# Patient Record
Sex: Female | Born: 1950 | Race: White | Hispanic: No | Smoking: Former smoker
Health system: Southern US, Community
[De-identification: ages and names within clinical notes are randomized; demographics above are authoritative.]

## PROBLEM LIST (undated history)

## (undated) DIAGNOSIS — M1990 Unspecified osteoarthritis, unspecified site: Secondary | ICD-10-CM

## (undated) DIAGNOSIS — J45909 Unspecified asthma, uncomplicated: Secondary | ICD-10-CM

## (undated) DIAGNOSIS — I10 Essential (primary) hypertension: Secondary | ICD-10-CM

## (undated) DIAGNOSIS — K5790 Diverticulosis of intestine, part unspecified, without perforation or abscess without bleeding: Secondary | ICD-10-CM

## (undated) HISTORY — PX: TONSILLECTOMY: SUR1361

## (undated) HISTORY — PX: DILATION AND CURETTAGE OF UTERUS: SHX78

## (undated) HISTORY — PX: APPENDECTOMY: SHX54

---

## 2001-10-09 HISTORY — PX: CARPAL TUNNEL RELEASE: SHX101

## 2009-10-09 HISTORY — PX: ROTATOR CUFF REPAIR: SHX139

## 2014-12-03 DIAGNOSIS — E669 Obesity, unspecified: Secondary | ICD-10-CM | POA: Insufficient documentation

## 2014-12-03 DIAGNOSIS — J45909 Unspecified asthma, uncomplicated: Secondary | ICD-10-CM | POA: Insufficient documentation

## 2015-03-15 DIAGNOSIS — E785 Hyperlipidemia, unspecified: Secondary | ICD-10-CM | POA: Insufficient documentation

## 2015-03-15 DIAGNOSIS — R7301 Impaired fasting glucose: Secondary | ICD-10-CM | POA: Insufficient documentation

## 2015-03-15 DIAGNOSIS — I10 Essential (primary) hypertension: Secondary | ICD-10-CM | POA: Insufficient documentation

## 2017-06-18 ENCOUNTER — Other Ambulatory Visit: Payer: Self-pay | Admitting: Family Medicine

## 2017-06-18 DIAGNOSIS — Z1231 Encounter for screening mammogram for malignant neoplasm of breast: Secondary | ICD-10-CM

## 2017-06-26 ENCOUNTER — Encounter: Payer: Self-pay | Admitting: Radiology

## 2017-06-26 ENCOUNTER — Ambulatory Visit
Admission: RE | Admit: 2017-06-26 | Discharge: 2017-06-26 | Disposition: A | Discharge status: Home / Self Care | Payer: Medicare Other | Source: Ambulatory Visit | Attending: Family Medicine | Admitting: Family Medicine

## 2017-06-26 DIAGNOSIS — Z1231 Encounter for screening mammogram for malignant neoplasm of breast: Secondary | ICD-10-CM | POA: Diagnosis present

## 2017-07-04 ENCOUNTER — Encounter
Admission: RE | Admit: 2017-07-04 | Discharge: 2017-07-04 | Disposition: A | Discharge status: Home / Self Care | Payer: Medicare Other | Source: Ambulatory Visit | Attending: Orthopedic Surgery | Admitting: Orthopedic Surgery

## 2017-07-04 DIAGNOSIS — Z79899 Other long term (current) drug therapy: Secondary | ICD-10-CM | POA: Diagnosis not present

## 2017-07-04 DIAGNOSIS — Z7982 Long term (current) use of aspirin: Secondary | ICD-10-CM | POA: Insufficient documentation

## 2017-07-04 DIAGNOSIS — I10 Essential (primary) hypertension: Secondary | ICD-10-CM | POA: Insufficient documentation

## 2017-07-04 DIAGNOSIS — Z01818 Encounter for other preprocedural examination: Secondary | ICD-10-CM | POA: Insufficient documentation

## 2017-07-04 DIAGNOSIS — M1712 Unilateral primary osteoarthritis, left knee: Secondary | ICD-10-CM | POA: Diagnosis not present

## 2017-07-04 DIAGNOSIS — R9431 Abnormal electrocardiogram [ECG] [EKG]: Secondary | ICD-10-CM | POA: Diagnosis not present

## 2017-07-04 HISTORY — DX: Diverticulosis of intestine, part unspecified, without perforation or abscess without bleeding: K57.90

## 2017-07-04 HISTORY — DX: Unspecified asthma, uncomplicated: J45.909

## 2017-07-04 HISTORY — DX: Unspecified osteoarthritis, unspecified site: M19.90

## 2017-07-04 HISTORY — DX: Essential (primary) hypertension: I10

## 2017-07-04 LAB — CBC
HCT: 45.4 % (ref 35.0–47.0)
HEMOGLOBIN: 15.2 g/dL (ref 12.0–16.0)
MCH: 30.9 pg (ref 26.0–34.0)
MCHC: 33.5 g/dL (ref 32.0–36.0)
MCV: 92.5 fL (ref 80.0–100.0)
Platelets: 265 10*3/uL (ref 150–440)
RBC: 4.91 MIL/uL (ref 3.80–5.20)
RDW: 14.2 % (ref 11.5–14.5)
WBC: 11.1 10*3/uL — ABNORMAL HIGH (ref 3.6–11.0)

## 2017-07-04 LAB — URINALYSIS, ROUTINE W REFLEX MICROSCOPIC
Bilirubin Urine: NEGATIVE
GLUCOSE, UA: NEGATIVE mg/dL
HGB URINE DIPSTICK: NEGATIVE
Ketones, ur: NEGATIVE mg/dL
Leukocytes, UA: NEGATIVE
Nitrite: NEGATIVE
PH: 7 (ref 5.0–8.0)
Protein, ur: NEGATIVE mg/dL
SPECIFIC GRAVITY, URINE: 1.015 (ref 1.005–1.030)

## 2017-07-04 LAB — COMPREHENSIVE METABOLIC PANEL
ALK PHOS: 52 U/L (ref 38–126)
ALT: 21 U/L (ref 14–54)
ANION GAP: 13 (ref 5–15)
AST: 23 U/L (ref 15–41)
Albumin: 4.8 g/dL (ref 3.5–5.0)
BILIRUBIN TOTAL: 0.5 mg/dL (ref 0.3–1.2)
BUN: 15 mg/dL (ref 6–20)
CALCIUM: 9.9 mg/dL (ref 8.9–10.3)
CO2: 26 mmol/L (ref 22–32)
Chloride: 103 mmol/L (ref 101–111)
Creatinine, Ser: 0.6 mg/dL (ref 0.44–1.00)
GFR calc non Af Amer: >60 mL/min (ref >60)
Glucose, Bld: 100 mg/dL — ABNORMAL HIGH (ref 65–99)
Potassium: 3.4 mmol/L — ABNORMAL LOW (ref 3.5–5.1)
SODIUM: 142 mmol/L (ref 135–145)
TOTAL PROTEIN: 7.6 g/dL (ref 6.5–8.1)

## 2017-07-04 LAB — PROTIME-INR
INR: 0.87
PROTHROMBIN TIME: 11.7 s (ref 11.4–15.2)

## 2017-07-04 LAB — TYPE AND SCREEN
ABO/RH(D): O POS
Antibody Screen: NEGATIVE

## 2017-07-04 LAB — SEDIMENTATION RATE: Sed Rate: 1 mm/hr (ref 0–30)

## 2017-07-04 LAB — APTT: aPTT: 34 seconds (ref 24–36)

## 2017-07-04 LAB — SURGICAL PCR SCREEN
MRSA, PCR: NEGATIVE
STAPHYLOCOCCUS AUREUS: POSITIVE — AB

## 2017-07-04 LAB — C-REACTIVE PROTEIN: CRP: 1.1 mg/dL — ABNORMAL HIGH (ref <1.0)

## 2017-07-04 NOTE — Patient Instructions (Signed)
Your procedure is scheduled on: July 16, 2017 [facility] ) Report to Same Day Surgery 2nd floor medical mall (Medical Mall Entrance-take elevator on left to 2nd floor.  Check in with surgery information desk.) To find out your arrival time please call [phone] between 1PM - 3PM on July 13, 2017 (FRIDAY )   Remember: Instructions that are not followed completely may result in serious medical risk, up to and including death, or upon the discretion of your surgeon and anesthesiologist your surgery may need to be rescheduled.    _x___ 1. Do not eat food after midnight the night before your procedure. You may drink clear liquids up to 2 hours before you are scheduled to arrive at the hospital for your procedure.  Do not drink clear liquids within 2 hours of your scheduled arrival to the hospital.  Clear liquids include  --Water or Apple juice without pulp  --Clear carbohydrate beverage such as ClearFast or Gatorade  --Black Coffee or Clear Tea (No milk, no creamers, do not add anything to                  the coffee or Tea Type 1 and type 2 diabetics should only drink water.  No gum chewing or hard candies.     __x__ 2. No Alcohol for 24 hours before or after surgery.   __x__3. No Smoking for 24 prior to surgery.   ____  4. Bring all medications with you on the day of surgery if instructed.    __x__ 5. Notify your doctor if there is any change in your medical condition     (cold, fever, infections).     Do not wear jewelry, make-up, hairpins, clips or nail polish.  Do not wear lotions, powders, or perfumes.  Do not shave 48 hours prior to surgery. Men may shave face and neck.  Do not bring valuables to the hospital.    [facility] is not responsible for any belongings or valuables.               Contacts, dentures or bridgework may not be worn into surgery.  Leave your suitcase in the car. After surgery it may be brought to your room.  For patients admitted to the hospital,  discharge time is determined by your treatment team                   Patients discharged the day of surgery will not be allowed to drive home.  You will need someone to drive you home and stay with you the night of your procedure.    Please read over the following fact sheets that you were given:   [facility] Preparing for Surgery and or MRSA Information  ___ Take anti-hypertensive listed below, cardiac, seizure, asthma,     anti-reflux and psychiatric medicines. These include:  1.   2.  3.  4.  5.  6.  ____Fleets enema or Magnesium Citrate as directed.   _x___ Use CHG Soap or sage wipes as directed on instruction sheet   _x___ Use inhalers on the day of surgery and bring to hospital day of surgery (USE ARNUITY ELLIPTA AND ALBUTEROL INHALERS THE MORNING OF SURGERY AND BRING INHALERS TO HOSPITAL THE DAY OF SURGERY  )  ____ Stop Metformin and Janumet 2 days prior to surgery.    ____ Take 1/2 of usual insulin dose the night before surgery and none on the morning surgery.     _x___ Follow  recommendations from Cardiologist, Pulmonologist or PCP regarding          stopping Aspirin, Coumadin, Plavix ,Eliquis, Effient, or Pradaxa, and Pletal. (STOP ASPIRIN ONE WEEK PRIOR TO SURGERY )   X____Stop Anti-inflammatories such as Advil, Aleve, Ibuprofen, Motrin, Naproxen, Naprosyn, Goodies powders or aspirin products. OK to take Tylenol    _x___ Stop supplements until after surgery.  But may continue Vitamin D, Vitamin B, and multivitamin (STOP VITAMIN C, VITAMIN E, BIOTIN, AND MELATONIN NOW )        ____ Bring C-Pap to the hospital.

## 2017-07-04 NOTE — Pre-Procedure Instructions (Signed)
Positive staph results faxed to Dr. Ernest Pine office.

## 2017-07-05 LAB — URINE CULTURE
CULTURE: NO GROWTH
SPECIAL REQUESTS: NORMAL

## 2017-07-15 MED ORDER — TRANEXAMIC ACID 1000 MG/10ML IV SOLN
1000.0000 mg | INTRAVENOUS | Status: DC
  Filled 2017-07-15: qty 10

## 2017-07-15 MED ORDER — CEFAZOLIN SODIUM-DEXTROSE 2-4 GM/100ML-% IV SOLN
2.0000 g | INTRAVENOUS | Status: AC
  Administered 2017-07-16: 2 g via INTRAVENOUS

## 2017-07-16 ENCOUNTER — Inpatient Hospital Stay: Payer: Medicare Other | Admitting: Anesthesiology

## 2017-07-16 ENCOUNTER — Inpatient Hospital Stay: Payer: Medicare Other

## 2017-07-16 ENCOUNTER — Inpatient Hospital Stay
Admission: RE | Admit: 2017-07-16 | Discharge: 2017-07-18 | DRG: 470 | Disposition: A | Discharge status: Home Health | Payer: Medicare Other | Source: Ambulatory Visit | Attending: Orthopedic Surgery | Admitting: Orthopedic Surgery

## 2017-07-16 ENCOUNTER — Encounter
Admission: RE | Disposition: A | Discharge status: Home Health | Payer: Self-pay | Source: Ambulatory Visit | Attending: Orthopedic Surgery

## 2017-07-16 ENCOUNTER — Encounter: Payer: Self-pay | Admitting: Orthopedic Surgery

## 2017-07-16 DIAGNOSIS — Z885 Allergy status to narcotic agent status: Secondary | ICD-10-CM | POA: Diagnosis not present

## 2017-07-16 DIAGNOSIS — Z882 Allergy status to sulfonamides status: Secondary | ICD-10-CM

## 2017-07-16 DIAGNOSIS — I10 Essential (primary) hypertension: Secondary | ICD-10-CM | POA: Diagnosis present

## 2017-07-16 DIAGNOSIS — Z79899 Other long term (current) drug therapy: Secondary | ICD-10-CM

## 2017-07-16 DIAGNOSIS — Z7951 Long term (current) use of inhaled steroids: Secondary | ICD-10-CM

## 2017-07-16 DIAGNOSIS — K5790 Diverticulosis of intestine, part unspecified, without perforation or abscess without bleeding: Secondary | ICD-10-CM | POA: Diagnosis present

## 2017-07-16 DIAGNOSIS — Z7982 Long term (current) use of aspirin: Secondary | ICD-10-CM | POA: Diagnosis not present

## 2017-07-16 DIAGNOSIS — M1712 Unilateral primary osteoarthritis, left knee: Secondary | ICD-10-CM | POA: Diagnosis present

## 2017-07-16 DIAGNOSIS — J45909 Unspecified asthma, uncomplicated: Secondary | ICD-10-CM | POA: Diagnosis present

## 2017-07-16 DIAGNOSIS — Z87891 Personal history of nicotine dependence: Secondary | ICD-10-CM | POA: Diagnosis not present

## 2017-07-16 DIAGNOSIS — Z96652 Presence of left artificial knee joint: Secondary | ICD-10-CM

## 2017-07-16 DIAGNOSIS — Z888 Allergy status to other drugs, medicaments and biological substances status: Secondary | ICD-10-CM | POA: Diagnosis not present

## 2017-07-16 DIAGNOSIS — Z881 Allergy status to other antibiotic agents status: Secondary | ICD-10-CM

## 2017-07-16 DIAGNOSIS — Z96659 Presence of unspecified artificial knee joint: Secondary | ICD-10-CM

## 2017-07-16 HISTORY — PX: KNEE ARTHROPLASTY: SHX992

## 2017-07-16 LAB — ABO/RH: ABO/RH(D): O POS

## 2017-07-16 SURGERY — ARTHROPLASTY, KNEE, TOTAL, USING IMAGELESS COMPUTER-ASSISTED NAVIGATION
Anesthesia: General | Site: Knee | Laterality: Left | Wound class: Clean

## 2017-07-16 MED ORDER — SODIUM CHLORIDE 0.9 % IV SOLN
INTRAVENOUS | Status: DC
  Administered 2017-07-16 (×2): via INTRAVENOUS

## 2017-07-16 MED ORDER — ALBUTEROL SULFATE (2.5 MG/3ML) 0.083% IN NEBU: 2.5000 mg | INHALATION_SOLUTION | Freq: Four times a day (QID) | RESPIRATORY_TRACT | Status: DC | PRN

## 2017-07-16 MED ORDER — PROPOFOL 500 MG/50ML IV EMUL
INTRAVENOUS | Status: AC
  Filled 2017-07-16: qty 50

## 2017-07-16 MED ORDER — MAGNESIUM HYDROXIDE 400 MG/5ML PO SUSP
30.0000 mL | Freq: Every day | ORAL | Status: DC | PRN
  Administered 2017-07-16 – 2017-07-18 (×2): 30 mL via ORAL
  Filled 2017-07-16 (×2): qty 30

## 2017-07-16 MED ORDER — ONDANSETRON HCL 4 MG/2ML IJ SOLN
4.0000 mg | Freq: Four times a day (QID) | INTRAMUSCULAR | Status: DC | PRN
  Administered 2017-07-17: 4 mg via INTRAVENOUS
  Filled 2017-07-16: qty 2

## 2017-07-16 MED ORDER — MIDAZOLAM HCL 2 MG/2ML IJ SOLN
INTRAMUSCULAR | Status: AC
  Filled 2017-07-16: qty 2

## 2017-07-16 MED ORDER — DEXTROSE 5 % IV SOLN
2.0000 g | Freq: Four times a day (QID) | INTRAVENOUS | Status: AC
  Administered 2017-07-16 – 2017-07-17 (×4): 2 g via INTRAVENOUS
  Filled 2017-07-16 (×4): qty 2000

## 2017-07-16 MED ORDER — LORATADINE 10 MG PO TABS: 10.0000 mg | ORAL_TABLET | Freq: Every day | ORAL | Status: DC | PRN

## 2017-07-16 MED ORDER — SODIUM CHLORIDE 0.9 % IV SOLN
INTRAVENOUS | Status: DC | PRN
  Administered 2017-07-16: 60 mL

## 2017-07-16 MED ORDER — VITAMIN E 180 MG (400 UNIT) PO CAPS
400.0000 [IU] | ORAL_CAPSULE | Freq: Every day | ORAL | Status: DC
  Administered 2017-07-17 – 2017-07-18 (×2): 400 [IU] via ORAL
  Filled 2017-07-16 (×3): qty 1

## 2017-07-16 MED ORDER — VITAMIN C 500 MG PO TABS
1500.0000 mg | ORAL_TABLET | Freq: Every day | ORAL | Status: DC
  Administered 2017-07-17 – 2017-07-18 (×2): 1500 mg via ORAL
  Filled 2017-07-16 (×3): qty 3

## 2017-07-16 MED ORDER — DIPHENHYDRAMINE HCL 12.5 MG/5ML PO ELIX: 12.5000 mg | ORAL_SOLUTION | ORAL | Status: DC | PRN

## 2017-07-16 MED ORDER — HYDROCHLOROTHIAZIDE 12.5 MG PO CAPS
12.5000 mg | ORAL_CAPSULE | Freq: Every day | ORAL | Status: DC
  Administered 2017-07-16 – 2017-07-18 (×3): 12.5 mg via ORAL
  Filled 2017-07-16 (×3): qty 1

## 2017-07-16 MED ORDER — PROPOFOL 500 MG/50ML IV EMUL
INTRAVENOUS | Status: DC | PRN
  Administered 2017-07-16: 125 ug/kg/min via INTRAVENOUS

## 2017-07-16 MED ORDER — BUDESONIDE 0.5 MG/2ML IN SUSP
0.5000 mg | Freq: Two times a day (BID) | RESPIRATORY_TRACT | Status: DC
  Filled 2017-07-16: qty 2

## 2017-07-16 MED ORDER — SUCCINYLCHOLINE CHLORIDE 20 MG/ML IJ SOLN
INTRAMUSCULAR | Status: AC
  Filled 2017-07-16: qty 1

## 2017-07-16 MED ORDER — FAMOTIDINE 20 MG PO TABS: 20.0000 mg | ORAL_TABLET | Freq: Once | ORAL | Status: DC

## 2017-07-16 MED ORDER — PHENOL 1.4 % MT LIQD
1.0000 | OROMUCOSAL | Status: DC | PRN
  Filled 2017-07-16: qty 177

## 2017-07-16 MED ORDER — MORPHINE SULFATE (PF) 2 MG/ML IV SOLN
2.0000 mg | INTRAVENOUS | Status: DC | PRN
  Administered 2017-07-16 (×2): 2 mg via INTRAVENOUS
  Filled 2017-07-16 (×2): qty 1

## 2017-07-16 MED ORDER — BUDESONIDE 0.5 MG/2ML IN SUSP
0.5000 mg | Freq: Two times a day (BID) | RESPIRATORY_TRACT | Status: DC | PRN
Start: 2017-07-16 — End: 2017-07-18

## 2017-07-16 MED ORDER — ACETAMINOPHEN 650 MG RE SUPP: 650.0000 mg | Freq: Four times a day (QID) | RECTAL | Status: DC | PRN

## 2017-07-16 MED ORDER — CEFAZOLIN SODIUM-DEXTROSE 2-4 GM/100ML-% IV SOLN
INTRAVENOUS | Status: AC
  Filled 2017-07-16: qty 100

## 2017-07-16 MED ORDER — MENTHOL 3 MG MT LOZG
1.0000 | LOZENGE | OROMUCOSAL | Status: DC | PRN
  Filled 2017-07-16: qty 9

## 2017-07-16 MED ORDER — SODIUM CHLORIDE 0.9 % IJ SOLN
INTRAMUSCULAR | Status: AC
  Filled 2017-07-16: qty 50

## 2017-07-16 MED ORDER — ACETAMINOPHEN 10 MG/ML IV SOLN
1000.0000 mg | Freq: Four times a day (QID) | INTRAVENOUS | Status: AC
  Administered 2017-07-16 – 2017-07-17 (×4): 1000 mg via INTRAVENOUS
  Filled 2017-07-16 (×3): qty 100

## 2017-07-16 MED ORDER — FENTANYL CITRATE (PF) 100 MCG/2ML IJ SOLN
INTRAMUSCULAR | Status: AC
  Filled 2017-07-16: qty 2

## 2017-07-16 MED ORDER — ONDANSETRON HCL 4 MG/2ML IJ SOLN: 4.0000 mg | Freq: Once | INTRAMUSCULAR | Status: DC | PRN

## 2017-07-16 MED ORDER — TRANEXAMIC ACID 1000 MG/10ML IV SOLN
INTRAVENOUS | Status: DC | PRN
  Administered 2017-07-16: 1000 mg via INTRAVENOUS

## 2017-07-16 MED ORDER — METOCLOPRAMIDE HCL 10 MG PO TABS
10.0000 mg | ORAL_TABLET | Freq: Three times a day (TID) | ORAL | Status: AC
  Administered 2017-07-16 – 2017-07-18 (×8): 10 mg via ORAL
  Filled 2017-07-16 (×8): qty 1

## 2017-07-16 MED ORDER — POTASSIUM 99 MG PO TABS: 99.0000 mg | ORAL_TABLET | Freq: Every day | ORAL | Status: DC

## 2017-07-16 MED ORDER — EPHEDRINE SULFATE 50 MG/ML IJ SOLN
INTRAMUSCULAR | Status: AC
  Filled 2017-07-16: qty 1

## 2017-07-16 MED ORDER — ACETAMINOPHEN 10 MG/ML IV SOLN
INTRAVENOUS | Status: AC
Start: 2017-07-16 — End: 2017-07-16
  Administered 2017-07-16: 1000 mg via INTRAVENOUS
  Filled 2017-07-16: qty 100

## 2017-07-16 MED ORDER — FAMOTIDINE 20 MG PO TABS
ORAL_TABLET | ORAL | Status: AC
  Administered 2017-07-16: 20 mg
  Filled 2017-07-16: qty 1

## 2017-07-16 MED ORDER — FLEET ENEMA 7-19 GM/118ML RE ENEM: 1.0000 | ENEMA | Freq: Once | RECTAL | Status: DC | PRN

## 2017-07-16 MED ORDER — BUPIVACAINE HCL (PF) 0.25 % IJ SOLN
INTRAMUSCULAR | Status: DC | PRN
Start: 2017-07-16 — End: 2017-07-16
  Administered 2017-07-16: 60 mL

## 2017-07-16 MED ORDER — MONTELUKAST SODIUM 10 MG PO TABS
10.0000 mg | ORAL_TABLET | Freq: Every day | ORAL | Status: DC
  Administered 2017-07-16 – 2017-07-17 (×2): 10 mg via ORAL
  Filled 2017-07-16 (×2): qty 1

## 2017-07-16 MED ORDER — FENTANYL CITRATE (PF) 100 MCG/2ML IJ SOLN
INTRAMUSCULAR | Status: DC | PRN
  Administered 2017-07-16: 25 ug via INTRAVENOUS

## 2017-07-16 MED ORDER — SORBITOL 70 % SOLN
30.0000 mL | Freq: Every day | Status: DC | PRN
  Filled 2017-07-16: qty 30

## 2017-07-16 MED ORDER — CALCIUM POLYCARBOPHIL 625 MG PO TABS
625.0000 mg | ORAL_TABLET | Freq: Two times a day (BID) | ORAL | Status: DC
  Administered 2017-07-17 – 2017-07-18 (×2): 625 mg via ORAL
  Filled 2017-07-16 (×7): qty 1

## 2017-07-16 MED ORDER — GLYCOPYRROLATE 0.2 MG/ML IJ SOLN
INTRAMUSCULAR | Status: AC
  Filled 2017-07-16: qty 1

## 2017-07-16 MED ORDER — ADULT MULTIVITAMIN W/MINERALS CH
1.0000 | ORAL_TABLET | Freq: Every day | ORAL | Status: DC
  Administered 2017-07-16 – 2017-07-17 (×2): 1 via ORAL
  Filled 2017-07-16 (×2): qty 1

## 2017-07-16 MED ORDER — PHENYLEPHRINE HCL 10 MG/ML IJ SOLN
INTRAMUSCULAR | Status: AC
  Filled 2017-07-16: qty 1

## 2017-07-16 MED ORDER — MIDAZOLAM HCL 5 MG/5ML IJ SOLN
INTRAMUSCULAR | Status: DC | PRN
  Administered 2017-07-16 (×2): 1 mg via INTRAVENOUS

## 2017-07-16 MED ORDER — ONDANSETRON HCL 4 MG PO TABS
4.0000 mg | ORAL_TABLET | Freq: Four times a day (QID) | ORAL | Status: DC | PRN
  Administered 2017-07-17: 4 mg via ORAL
  Filled 2017-07-16: qty 1

## 2017-07-16 MED ORDER — ONDANSETRON HCL 4 MG/2ML IJ SOLN
INTRAMUSCULAR | Status: AC
  Filled 2017-07-16: qty 2

## 2017-07-16 MED ORDER — CEFAZOLIN SODIUM-DEXTROSE 2-4 GM/100ML-% IV SOLN: 2.0000 g | Freq: Four times a day (QID) | INTRAVENOUS | Status: DC

## 2017-07-16 MED ORDER — CHLORHEXIDINE GLUCONATE 4 % EX LIQD: 60.0000 mL | Freq: Once | CUTANEOUS | Status: DC

## 2017-07-16 MED ORDER — FENTANYL CITRATE (PF) 100 MCG/2ML IJ SOLN: 25.0000 ug | INTRAMUSCULAR | Status: DC | PRN

## 2017-07-16 MED ORDER — BUPIVACAINE HCL (PF) 0.5 % IJ SOLN
INTRAMUSCULAR | Status: AC
  Filled 2017-07-16: qty 10

## 2017-07-16 MED ORDER — ONDANSETRON HCL 4 MG/2ML IJ SOLN
INTRAMUSCULAR | Status: DC | PRN
  Administered 2017-07-16: 4 mg via INTRAVENOUS

## 2017-07-16 MED ORDER — ACETAMINOPHEN 325 MG PO TABS
650.0000 mg | ORAL_TABLET | Freq: Four times a day (QID) | ORAL | Status: DC | PRN
  Administered 2017-07-18: 650 mg via ORAL
  Filled 2017-07-16 (×2): qty 2

## 2017-07-16 MED ORDER — LACTATED RINGERS IV SOLN
INTRAVENOUS | Status: DC
  Administered 2017-07-16: 06:00:00 via INTRAVENOUS

## 2017-07-16 MED ORDER — LIDOCAINE HCL (PF) 2 % IJ SOLN
INTRAMUSCULAR | Status: AC
  Filled 2017-07-16: qty 10

## 2017-07-16 MED ORDER — BUPIVACAINE HCL (PF) 0.25 % IJ SOLN
INTRAMUSCULAR | Status: AC
  Filled 2017-07-16: qty 10

## 2017-07-16 MED ORDER — NEOMYCIN-POLYMYXIN B GU 40-200000 IR SOLN
Status: AC
  Filled 2017-07-16: qty 20

## 2017-07-16 MED ORDER — FERROUS SULFATE 325 (65 FE) MG PO TABS
325.0000 mg | ORAL_TABLET | Freq: Two times a day (BID) | ORAL | Status: DC
  Administered 2017-07-16 – 2017-07-18 (×4): 325 mg via ORAL
  Filled 2017-07-16 (×4): qty 1

## 2017-07-16 MED ORDER — NEOMYCIN-POLYMYXIN B GU 40-200000 IR SOLN
Status: DC | PRN
Start: 2017-07-16 — End: 2017-07-16
  Administered 2017-07-16: 14 mL

## 2017-07-16 MED ORDER — SENNOSIDES-DOCUSATE SODIUM 8.6-50 MG PO TABS
1.0000 | ORAL_TABLET | Freq: Two times a day (BID) | ORAL | Status: DC
Start: 2017-07-16 — End: 2017-07-18
  Administered 2017-07-16 – 2017-07-18 (×3): 1 via ORAL
  Filled 2017-07-16 (×4): qty 1

## 2017-07-16 MED ORDER — ALUM & MAG HYDROXIDE-SIMETH 200-200-20 MG/5ML PO SUSP: 30.0000 mL | ORAL | Status: DC | PRN

## 2017-07-16 MED ORDER — POLYVINYL ALCOHOL 1.4 % OP SOLN
1.0000 [drp] | Freq: Three times a day (TID) | OPHTHALMIC | Status: DC | PRN
Start: 2017-07-16 — End: 2017-07-18
  Filled 2017-07-16: qty 15

## 2017-07-16 MED ORDER — ENOXAPARIN SODIUM 30 MG/0.3ML ~~LOC~~ SOLN
30.0000 mg | Freq: Two times a day (BID) | SUBCUTANEOUS | Status: DC
  Administered 2017-07-17 (×2): 30 mg via SUBCUTANEOUS
  Filled 2017-07-16 (×2): qty 0.3

## 2017-07-16 MED ORDER — PANTOPRAZOLE SODIUM 40 MG PO TBEC
40.0000 mg | DELAYED_RELEASE_TABLET | Freq: Two times a day (BID) | ORAL | Status: DC
  Administered 2017-07-16 – 2017-07-18 (×4): 40 mg via ORAL
  Filled 2017-07-16 (×4): qty 1

## 2017-07-16 MED ORDER — BUPIVACAINE LIPOSOME 1.3 % IJ SUSP
INTRAMUSCULAR | Status: AC
  Filled 2017-07-16: qty 20

## 2017-07-16 MED ORDER — OXYCODONE HCL 5 MG PO TABS
5.0000 mg | ORAL_TABLET | ORAL | Status: DC | PRN
  Administered 2017-07-16: 5 mg via ORAL
  Administered 2017-07-16: 10 mg via ORAL
  Administered 2017-07-16: 5 mg via ORAL
  Administered 2017-07-16 – 2017-07-17 (×3): 10 mg via ORAL
  Filled 2017-07-16 (×2): qty 2
  Filled 2017-07-16: qty 1
  Filled 2017-07-16: qty 2
  Filled 2017-07-16: qty 1
  Filled 2017-07-16 (×2): qty 2

## 2017-07-16 MED ORDER — BUPIVACAINE HCL (PF) 0.5 % IJ SOLN
INTRAMUSCULAR | Status: DC | PRN
  Administered 2017-07-16: 3 mL

## 2017-07-16 MED ORDER — ALBUTEROL SULFATE HFA 108 (90 BASE) MCG/ACT IN AERS: 2.0000 | INHALATION_SPRAY | Freq: Four times a day (QID) | RESPIRATORY_TRACT | Status: DC | PRN

## 2017-07-16 MED ORDER — TRAMADOL HCL 50 MG PO TABS
50.0000 mg | ORAL_TABLET | ORAL | Status: DC | PRN
  Administered 2017-07-16 – 2017-07-18 (×6): 100 mg via ORAL
  Filled 2017-07-16 (×6): qty 2

## 2017-07-16 MED ORDER — TRANEXAMIC ACID 1000 MG/10ML IV SOLN
1000.0000 mg | Freq: Once | INTRAVENOUS | Status: AC
  Administered 2017-07-16: 1000 mg via INTRAVENOUS
  Filled 2017-07-16: qty 10

## 2017-07-16 SURGICAL SUPPLY — 69 items
ATTUNE PSFEM LTSZ6 NARCEM KNEE (Femur) ×3 IMPLANT
ATTUNE PSRP INSR SZ6 5 KNEE (Insert) ×2 IMPLANT
ATTUNE PSRP INSR SZ6 5MM KNEE (Insert) ×1 IMPLANT
BASEPLATE TIBIAL ROTATING SZ 4 (Knees) ×3 IMPLANT
BATTERY INSTRU NAVIGATION (MISCELLANEOUS) ×12 IMPLANT
BLADE SAW 1 (BLADE) ×3 IMPLANT
BLADE SAW 1/2 (BLADE) ×3 IMPLANT
BLADE SAW 70X12.5 (BLADE) IMPLANT
CANISTER SUCT 1200ML W/VALVE (MISCELLANEOUS) ×3 IMPLANT
CANISTER SUCT 3000ML PPV (MISCELLANEOUS) ×6 IMPLANT
CATH TRAY METER 16FR LF (MISCELLANEOUS) ×3 IMPLANT
CEMENT HV SMART SET (Cement) ×6 IMPLANT
COOLER POLAR GLACIER W/PUMP (MISCELLANEOUS) ×3 IMPLANT
CUFF TOURN 24 STER (MISCELLANEOUS) ×3 IMPLANT
CUFF TOURN 30 STER DUAL PORT (MISCELLANEOUS) IMPLANT
DRAPE SHEET LG 3/4 BI-LAMINATE (DRAPES) ×3 IMPLANT
DRSG DERMACEA 8X12 NADH (GAUZE/BANDAGES/DRESSINGS) ×3 IMPLANT
DRSG OPSITE POSTOP 4X14 (GAUZE/BANDAGES/DRESSINGS) ×3 IMPLANT
DRSG TEGADERM 4X4.75 (GAUZE/BANDAGES/DRESSINGS) ×3 IMPLANT
DURAPREP 26ML APPLICATOR (WOUND CARE) ×6 IMPLANT
ELECT CAUTERY BLADE 6.4 (BLADE) ×3 IMPLANT
ELECT REM PT RETURN 9FT ADLT (ELECTROSURGICAL) ×3
ELECTRODE REM PT RTRN 9FT ADLT (ELECTROSURGICAL) ×1 IMPLANT
EVACUATOR 1/8 PVC DRAIN (DRAIN) ×3 IMPLANT
EX-PIN ORTHOLOCK NAV 4X150 (PIN) ×6 IMPLANT
GLOVE BIO SURGEON STRL SZ7.5 (GLOVE) ×6 IMPLANT
GLOVE BIOGEL M STRL SZ7.5 (GLOVE) ×6 IMPLANT
GLOVE BIOGEL PI IND STRL 7.5 (GLOVE) ×3 IMPLANT
GLOVE BIOGEL PI IND STRL 9 (GLOVE) ×2 IMPLANT
GLOVE BIOGEL PI INDICATOR 7.5 (GLOVE) ×6
GLOVE BIOGEL PI INDICATOR 9 (GLOVE) ×4
GLOVE INDICATOR 8.0 STRL GRN (GLOVE) ×6 IMPLANT
GLOVE SURG SYN 9.0  PF PI (GLOVE) ×2
GLOVE SURG SYN 9.0 PF PI (GLOVE) ×1 IMPLANT
GOWN STRL REUS W/ TWL LRG LVL3 (GOWN DISPOSABLE) ×2 IMPLANT
GOWN STRL REUS W/TWL 2XL LVL3 (GOWN DISPOSABLE) ×3 IMPLANT
GOWN STRL REUS W/TWL LRG LVL3 (GOWN DISPOSABLE) ×4
HOLDER FOLEY CATH W/STRAP (MISCELLANEOUS) ×3 IMPLANT
HOOD PEEL AWAY FLYTE STAYCOOL (MISCELLANEOUS) ×6 IMPLANT
KIT RM TURNOVER STRD PROC AR (KITS) ×3 IMPLANT
KNIFE SCULPS 14X20 (INSTRUMENTS) ×3 IMPLANT
LABEL OR SOLS (LABEL) ×3 IMPLANT
NDL SAFETY 18GX1.5 (NEEDLE) ×3 IMPLANT
NEEDLE SPNL 20GX3.5 QUINCKE YW (NEEDLE) ×6 IMPLANT
NS IRRIG 500ML POUR BTL (IV SOLUTION) ×3 IMPLANT
PACK TOTAL KNEE (MISCELLANEOUS) ×3 IMPLANT
PAD WRAPON POLAR KNEE (MISCELLANEOUS) ×1 IMPLANT
PATELLA MEDIAL ATTUN 35MM KNEE (Knees) ×3 IMPLANT
PIN DRILL QUICK PACK ×3 IMPLANT
PIN FIXATION 1/8DIA X 3INL (PIN) ×3 IMPLANT
PULSAVAC PLUS IRRIG FAN TIP (DISPOSABLE) ×3
SOL .9 NS 3000ML IRR  AL (IV SOLUTION) ×2
SOL .9 NS 3000ML IRR UROMATIC (IV SOLUTION) ×1 IMPLANT
SOL PREP PVP 2OZ (MISCELLANEOUS) ×3
SOLUTION PREP PVP 2OZ (MISCELLANEOUS) ×1 IMPLANT
SPONGE DRAIN TRACH 4X4 STRL 2S (GAUZE/BANDAGES/DRESSINGS) ×3 IMPLANT
STAPLER SKIN PROX 35W (STAPLE) ×3 IMPLANT
STRAP TIBIA SHORT (MISCELLANEOUS) ×3 IMPLANT
SUCTION FRAZIER HANDLE 10FR (MISCELLANEOUS) ×2
SUCTION TUBE FRAZIER 10FR DISP (MISCELLANEOUS) ×1 IMPLANT
SUT VIC AB 0 CT1 36 (SUTURE) ×3 IMPLANT
SUT VIC AB 1 CT1 36 (SUTURE) ×6 IMPLANT
SUT VIC AB 2-0 CT2 27 (SUTURE) ×3 IMPLANT
SYR 20CC LL (SYRINGE) ×3 IMPLANT
SYR 30ML LL (SYRINGE) ×6 IMPLANT
TIP FAN IRRIG PULSAVAC PLUS (DISPOSABLE) ×1 IMPLANT
TOWEL OR 17X26 4PK STRL BLUE (TOWEL DISPOSABLE) ×3 IMPLANT
TOWER CARTRIDGE SMART MIX (DISPOSABLE) ×3 IMPLANT
WRAPON POLAR PAD KNEE (MISCELLANEOUS) ×3

## 2017-07-16 NOTE — Discharge Instructions (Signed)
°  Instructions after Total Knee Replacement ° ° Iraida Cragin P. Dmani Mizer, Jr., M.D.    ° Dept. of Orthopaedics & Sports Medicine ° [facility] ° [address] ° [place], [place]  [address] ° Phone: [phone]   Fax: [phone] ° °  °DIET: °• Drink plenty of non-alcoholic fluids. °• Resume your normal diet. Include foods high in fiber. ° °ACTIVITY:  °• You may use crutches or a walker with weight-bearing as tolerated, unless instructed otherwise. °• You may be weaned off of the walker or crutches by your Physical Therapist.  °• Do NOT place pillows under the knee. Anything placed under the knee could limit your ability to straighten the knee.   °• Continue doing gentle exercises. Exercising will reduce the pain and swelling, increase motion, and prevent muscle weakness.   °• Please continue to use the TED compression stockings for 6 weeks. You may remove the stockings at night, but should reapply them in the morning. °• Do not drive or operate any equipment until instructed. ° °WOUND CARE:  °• Continue to use the PolarCare or ice packs periodically to reduce pain and swelling. °• You may bathe or shower after the staples are removed at the first office visit following surgery. ° °MEDICATIONS: °• You may resume your regular medications. °• Please take the pain medication as prescribed on the medication. °• Do not take pain medication on an empty stomach. °• You have been given a prescription for a blood thinner (Lovenox or Coumadin). Please take the medication as instructed. (NOTE: After completing a 2 week course of Lovenox, take one Enteric-coated aspirin once a day. This along with elevation will help reduce the possibility of phlebitis in your operated leg.) °• Do not drive or drink alcoholic beverages when taking pain medications. ° °CALL THE OFFICE FOR: °• Temperature above 101 degrees °• Excessive bleeding or drainage on the dressing. °• Excessive swelling, coldness, or paleness of the toes. °• Persistent  nausea and vomiting. ° °FOLLOW-UP:  °• You should have an appointment to return to the office in 10-14 days after surgery. °• Arrangements have been made for continuation of Physical Therapy (either home therapy or outpatient therapy). °  °

## 2017-07-16 NOTE — Progress Notes (Signed)
Physical Therapy Evaluation Patient Details Name: Ruth Rojas MRN: [id number] DOB: May 03, 1951 Today's Date: 07/16/2017   History of Present Illness  Pt is s/p L TKA on 10/8.   Clinical Impression  Pt is a pleasant 66 year old female admitted for L TKA. Pt performs supine there-ex, bed mobility with supervision, transfers and amb with RW with min assist. Pt amb 15 ft from recliner to doorway, cues for proper sequencing of LEs, chair follow for safety as pt appeared to fatigue with further amb. Pt educated on WB status and importance of using bone foam or placing towel roll under L ankle. Pt demonstrates ability to perform 10 SLRs with independence, therefore does not require KI for mobility. Pt appeared to be experiencing pain throughout session, however was motivated to participate in all PT activities. Pt demonstrates deficits with L knee ROM/strength/endurance, bed mobility, transfers and amb. Would benefit from further skilled PT to address above deficits and promote optimal return to home. Recommend DC to HHPT upon DC from acute hospitalization.       Follow Up Recommendations Home health PT;Supervision for mobility/OOB (supervision for stairs)    Equipment Recommendations  Rolling walker with 5" wheels    Recommendations for Other Services       Precautions / Restrictions Precautions Precautions: Fall;Knee Precaution Booklet Issued: No Restrictions Weight Bearing Restrictions: Yes LLE Weight Bearing: Weight bearing as tolerated Other Position/Activity Restrictions: Support under L ankle      Mobility  Bed Mobility Overal bed mobility: Needs Assistance Bed Mobility: Supine to Sit     Supine to sit: Supervision     General bed mobility comments: Pt able to rise from supine to seated EOB, utilized bed rails, min cues need for proper technique.   Transfers Overall transfer level: Needs assistance Equipment used: Rolling walker (2 wheeled) Transfers: Sit to/from  Stand Sit to Stand: Min assist         General transfer comment: Pt able to rise from seated EOB to standing with RW, cues for proper hand placement on RW/bed, pt able to rise into standing with CGA. Able to BW through BLEs.   Ambulation/Gait Ambulation/Gait assistance: Min assist;+2 safety/equipment (chair follow) Ambulation Distance (Feet): 15 Feet Assistive device: Rolling walker (2 wheeled) Gait Pattern/deviations: Step-to pattern     General Gait Details: Pt amb with step-to gait pattern, cues for proper sequencing, cues to keep RW slightly forward to create space. Pt amb slowly, noted decreased stance time on L with further amb. Pt required one standing break to rest arms. No dizziness or LOB.   Stairs            Wheelchair Mobility    Modified Rankin (Stroke Patients Only)       Balance Overall balance assessment: Needs assistance Sitting-balance support: Feet supported Sitting balance-Leahy Scale: Good Sitting balance - Comments: Pt able to sit at EOB and recliner with supervision, no assistance needed. No LOB.    Standing balance support: Bilateral upper extremity supported Standing balance-Leahy Scale: Good Standing balance comment: Pt able to stand at EOB with RW with min assist, able to WB through BLEs, no dizziness or LOB.                             Pertinent Vitals/Pain Pain Assessment: 0-10 Pain Score: 8  Pain Location: R knee Pain Descriptors / Indicators: Operative site guarding Pain Intervention(s): Limited activity within patient's tolerance;Monitored during session;Repositioned;Ice applied  Home Living Family/patient expects to be discharged to:: Private residence Living Arrangements: Alone Available Help at Discharge: Family (daughter available for the foreseeable future) Type of Home: Apartment Home Access: Stairs to enter Entrance Stairs-Rails: Right Entrance Stairs-Number of Steps: 8 steps, landing, another 8 steps Home  Layout: One level Home Equipment: None      Prior Function Level of Independence: Independent         Comments: Pt independent with all ADLs and IADLs     Hand Dominance        Extremity/Trunk Assessment   Upper Extremity Assessment Upper Extremity Assessment: Overall WFL for tasks assessed    Lower Extremity Assessment Lower Extremity Assessment: Generalized weakness (RLE MMT grossly 4/5, sensation intact)       Communication   Communication: No difficulties  Cognition Arousal/Alertness: Awake/alert Behavior During Therapy: WFL for tasks assessed/performed Overall Cognitive Status: Within Functional Limits for tasks assessed                                        General Comments      Exercises Total Joint Exercises Goniometric ROM: L knee AAROM 0-50 degrees Other Exercises Other Exercises: Supine ther-ex B 10x, ankle pumps, SLRs, hip abd/add, quad sets. Seated ther-ex LLE 10x, heel slides. CGA for proper technique.    Assessment/Plan    PT Assessment Patient needs continued PT services  PT Problem List Decreased strength;Decreased range of motion;Decreased activity tolerance;Decreased mobility;Decreased knowledge of use of DME;Pain       PT Treatment Interventions DME instruction;Gait training;Stair training;Functional mobility training;Therapeutic activities;Therapeutic exercise;Patient/family education    PT Goals (Current goals can be found in the Care Plan section)  Acute Rehab PT Goals Patient Stated Goal: to return home PT Goal Formulation: With patient Time For Goal Achievement: 07/30/17 Potential to Achieve Goals: Good    Frequency BID   Barriers to discharge        Co-evaluation               AM-PAC PT "6 Clicks" Daily Activity  Outcome Measure Difficulty turning over in bed (including adjusting bedclothes, sheets and blankets)?: None Difficulty moving from lying on back to sitting on the side of the bed? :  None Difficulty sitting down on and standing up from a chair with arms (e.g., wheelchair, bedside commode, etc,.)?: Unable Help needed moving to and from a bed to chair (including a wheelchair)?: A Little Help needed walking in hospital room?: A Little Help needed climbing 3-5 steps with a railing? : A Lot 6 Click Score: 17    End of Session Equipment Utilized During Treatment: Gait belt Activity Tolerance: Patient tolerated treatment well;Patient limited by pain Patient left: in chair;with call bell/phone within reach;with bed alarm set;with family/visitor present   PT Visit Diagnosis: Other abnormalities of gait and mobility (R26.89);Muscle weakness (generalized) (M62.81);Pain Pain - Right/Left: Left Pain - part of body: Knee    Time: 1520-1605 PT Time Calculation (min) (ACUTE ONLY): 45 min   Charges:   PT Evaluation $PT Eval Low Complexity: 1 Low PT Treatments $Therapeutic Exercise: 23-37 mins   PT G Codes:   PT G-Codes **NOT FOR INPATIENT CLASS** Functional Assessment Tool Used: AM-PAC 6 Clicks Basic Mobility Functional Limitation: Mobility: Walking and moving around Mobility: Walking and Moving Around Current Status (Z6109): At least 40 percent but less than 60 percent impaired, limited or restricted Mobility: Walking and  Moving Around Goal Status [phone]): At least 20 percent but less than 40 percent impaired, limited or restricted   Renford Dills, SPT  Renford Dills 07/16/2017, 5:39 PM

## 2017-07-16 NOTE — Op Note (Signed)
OPERATIVE NOTE  DATE OF SURGERY:  07/16/2017  PATIENT NAME:  Vernica Wachtel Primo   DOB: 1951-04-15  MRN: [id number]  PRE-OPERATIVE DIAGNOSIS: Degenerative arthrosis of the left knee, primary  POST-OPERATIVE DIAGNOSIS:  Same  PROCEDURE:  Left total knee arthroplasty using computer-assisted navigation  SURGEON:  Jena Gauss. M.D.  ASSISTANT:  Van Clines, PA (present and scrubbed throughout the case, critical for assistance with exposure, retraction, instrumentation, and closure)  ANESTHESIA: spinal  ESTIMATED BLOOD LOSS: 50 mL  FLUIDS REPLACED: 600 mL of crystalloid  TOURNIQUET TIME: 100 minutes  DRAINS: 2 medium Hemovac drains  SOFT TISSUE RELEASES: Anterior cruciate ligament, posterior cruciate ligament, deep medial collateral ligament, patellofemoral ligament  IMPLANTS UTILIZED: DePuy Attune size 6N posterior stabilized femoral component (cemented), size 4 rotating platform tibial component (cemented), 35 mm medialized dome patella (cemented), and a 5 mm stabilized rotating platform polyethylene insert.  INDICATIONS FOR SURGERY: SEAIRA BYUS is a 66 y.o. year old female with a long history of progressive knee pain. X-rays demonstrated severe degenerative changes in tricompartmental fashion. The patient had not seen any significant improvement despite conservative nonsurgical intervention. After discussion of the risks and benefits of surgical intervention, the patient expressed understanding of the risks benefits and agree with plans for total knee arthroplasty.   The risks, benefits, and alternatives were discussed at length including but not limited to the risks of infection, bleeding, nerve injury, stiffness, blood clots, the need for revision surgery, cardiopulmonary complications, among others, and they were willing to proceed.  PROCEDURE IN DETAIL: The patient was brought into the operating room and, after adequate spinal anesthesia was achieved, a tourniquet was placed on  the patient's upper thigh. The patient's knee and leg were cleaned and prepped with alcohol and DuraPrep and draped in the usual sterile fashion. A "timeout" was performed as per usual protocol. The lower extremity was exsanguinated using an Esmarch, and the tourniquet was inflated to 300 mmHg. An anterior longitudinal incision was made followed by a standard mid vastus approach. The deep fibers of the medial collateral ligament were elevated in a subperiosteal fashion off of the medial flare of the tibia so as to maintain a continuous soft tissue sleeve. The patella was subluxed laterally and the patellofemoral ligament was incised. Inspection of the knee demonstrated severe degenerative changes with full-thickness loss of articular cartilage. Osteophytes were debrided using a rongeur. Anterior and posterior cruciate ligaments were excised. Two 4.0 mm Schanz pins were inserted in the femur and into the tibia for attachment of the array of trackers used for computer-assisted navigation. Hip center was identified using a circumduction technique. Distal landmarks were mapped using the computer. The distal femur and proximal tibia were mapped using the computer. The distal femoral cutting guide was positioned using computer-assisted navigation so as to achieve a 5 distal valgus cut. The femur was sized and it was felt that a size 6N femoral component was appropriate. A size 6 femoral cutting guide was positioned and the anterior cut was performed and verified using the computer. This was followed by completion of the posterior and chamfer cuts. Femoral cutting guide for the central box was then positioned in the center box cut was performed.  Attention was then directed to the proximal tibia. Medial and lateral menisci were excised. The extramedullary tibial cutting guide was positioned using computer-assisted navigation so as to achieve a 0 varus-valgus alignment and 3 posterior slope. The cut was performed and  verified using the computer. The proximal tibia  was sized and it was felt that a size 4 tibial tray was appropriate. Tibial and femoral trials were inserted followed by insertion of a 5 mm polyethylene insert. This allowed for excellent mediolateral soft tissue balancing both in flexion and in full extension. Finally, the patella was cut and prepared so as to accommodate a 35 mm medialized dome patella. A patella trial was placed and the knee was placed through a range of motion with excellent patellar tracking appreciated. The femoral trial was removed after debridement of posterior osteophytes. The central post-hole for the tibial component was reamed followed by insertion of a keel punch. Tibial trials were then removed. Cut surfaces of bone were irrigated with copious amounts of normal saline with antibiotic solution using pulsatile lavage and then suctioned dry. Polymethylmethacrylate cement was prepared in the usual fashion using a vacuum mixer. Cement was applied to the cut surface of the proximal tibia as well as along the undersurface of a size 4 rotating platform tibial component. Tibial component was positioned and impacted into place. Excess cement was removed using Personal assistant. Cement was then applied to the cut surfaces of the femur as well as along the posterior flanges of the size 6N femoral component. The femoral component was positioned and impacted into place. Excess cement was removed using Personal assistant. A 5 mm polyethylene trial was inserted and the knee was brought into full extension with steady axial compression applied. Finally, cement was applied to the backside of a 35 mm medialized dome patella and the patellar component was positioned and patellar clamp applied. Excess cement was removed using Personal assistant. After adequate curing of the cement, the tourniquet was deflated after a total tourniquet time of 100 minutes. Hemostasis was achieved using electrocautery. The knee was  irrigated with copious amounts of normal saline with antibiotic solution using pulsatile lavage and then suctioned dry. 20 mL of 1.3% Exparel and 60 mL of 0.25% Marcaine in 40 mL of normal saline was injected along the posterior capsule, medial and lateral gutters, and along the arthrotomy site. A 5 mm stabilized rotating platform polyethylene insert was inserted and the knee was placed through a range of motion with excellent mediolateral soft tissue balancing appreciated and excellent patellar tracking noted. 2 medium drains were placed in the wound bed and brought out through separate stab incisions. The medial parapatellar portion of the incision was reapproximated using interrupted sutures of #1 Vicryl. Subcutaneous tissue was approximated in layers using first #0 Vicryl followed #2-0 Vicryl. The skin was approximated with skin staples. A sterile dressing was applied.  The patient tolerated the procedure well and was transported to the recovery room in stable condition.    Navi Erber P. Angie Fava., M.D.

## 2017-07-16 NOTE — Anesthesia Post-op Follow-up Note (Signed)
Anesthesia QCDR form completed.        

## 2017-07-16 NOTE — Anesthesia Postprocedure Evaluation (Signed)
Anesthesia Post Note  Patient: Gisella B Regner  Procedure(s) Performed: COMPUTER ASSISTED TOTAL KNEE ARTHROPLASTY (Left Knee)  Patient location during evaluation: PACU Anesthesia Type: Spinal Level of consciousness: oriented and awake and alert Pain management: pain level controlled Vital Signs Assessment: post-procedure vital signs reviewed and stable Respiratory status: spontaneous breathing, respiratory function stable and patient connected to nasal cannula oxygen Cardiovascular status: blood pressure returned to baseline and stable Postop Assessment: no headache, no backache and no apparent nausea or vomiting Anesthetic complications: no     Last Vitals:  Vitals:   07/16/17 1207 07/16/17 1209  BP: 127/66 127/66  Pulse: 64 63  Resp: 11 11  Temp:    SpO2: 90% (!) 87%    Last Pain:  Vitals:   07/16/17 1109  TempSrc: Temporal  PainSc: 0-No pain                 Yevette Edwards

## 2017-07-16 NOTE — H&P (Signed)
The patient has been re-examined, and the chart reviewed, and there have been no interval changes to the documented history and physical.    The risks, benefits, and alternatives have been discussed at length. The patient expressed understanding of the risks benefits and agreed with plans for surgical intervention.  Derris Millan P. Letrice Pollok, Jr. M.D.    

## 2017-07-16 NOTE — Transfer of Care (Signed)
Immediate Anesthesia Transfer of Care Note  Patient: Ruth Rojas  Procedure(s) Performed: COMPUTER ASSISTED TOTAL KNEE ARTHROPLASTY (Left Knee)  Patient Location: PACU  Anesthesia Type:Spinal  Level of Consciousness: awake, alert , oriented and patient cooperative  Airway & Oxygen Therapy: Patient Spontanous Breathing and Patient connected to nasal cannula oxygen  Post-op Assessment: Report given to RN and Post -op Vital signs reviewed and stable  Post vital signs: Reviewed and stable  Last Vitals:  Vitals:   07/16/17 0641 07/16/17 1109  BP: (!) 171/77 129/61  Pulse: 93 69  Resp: 20 13  Temp: 36.5 C (!) 36.3 C  SpO2: 95% 97%    Last Pain:  Vitals:   07/16/17 1109  TempSrc: Temporal  PainSc: 0-No pain      Patients Stated Pain Goal: 0 (07/16/17 0641)  Complications: No apparent anesthesia complications

## 2017-07-16 NOTE — Anesthesia Procedure Notes (Signed)
Spinal  Patient location during procedure: OR Staffing Performed: anesthesiologist  Preanesthetic Checklist Completed: patient identified, site marked, surgical consent, pre-op evaluation, timeout performed, IV checked, risks and benefits discussed and monitors and equipment checked Spinal Block Patient position: sitting Prep: Betadine Patient monitoring: heart rate, continuous pulse ox, blood pressure and cardiac monitor Approach: midline Location: L4-5 Injection technique: single-shot Needle Needle type: Quincke  Needle gauge: 22 G Needle length: 9 cm Assessment Sensory level: T4 Additional Notes Negative paresthesia. Negative blood return. Positive free-flowing CSF. Expiration date of kit checked and confirmed. Patient tolerated procedure well, without complications.       

## 2017-07-16 NOTE — Clinical Social Work Note (Signed)
CSW received referral for SNF.  Case discussed with case manager and plan is to discharge home with home health.  CSW to sign off please re-consult if social work needs arise.  Acsa Estey R. Najia Hurlbutt, MSW, LCSWA [phone]  

## 2017-07-16 NOTE — Anesthesia Preprocedure Evaluation (Signed)
Anesthesia Evaluation  Patient identified by MRN, date of birth, ID band Patient awake    Reviewed: Allergy & Precautions, H&P , NPO status , Patient's Chart, lab work & pertinent test results, reviewed documented beta blocker date and time   Airway Mallampati: II   Neck ROM: full    Dental  (+) Poor Dentition   Pulmonary neg pulmonary ROS, neg shortness of breath, asthma , former smoker,    Pulmonary exam normal        Cardiovascular Exercise Tolerance: Good hypertension, On Medications negative cardio ROS Normal cardiovascular exam Rhythm:regular Rate:Normal     Neuro/Psych negative neurological ROS  negative psych ROS   GI/Hepatic negative GI ROS, Neg liver ROS,   Endo/Other  negative endocrine ROS  Renal/GU negative Renal ROS  negative genitourinary   Musculoskeletal   Abdominal   Peds  Hematology negative hematology ROS (+)   Anesthesia Other Findings Past Medical History: No date: Arthritis No date: Asthma No date: Diverticulosis No date: Hypertension Past Surgical History: No date: APPENDECTOMY 2003: CARPAL TUNNEL RELEASE 1980: CESAREAN SECTION No date: DILATION AND CURETTAGE OF UTERUS 2011: ROTATOR CUFF REPAIR; Left No date: TONSILLECTOMY BMI    Body Mass Index:  38.04 kg/m     Reproductive/Obstetrics negative OB ROS                             Anesthesia Physical Anesthesia Plan  ASA: III  Anesthesia Plan: General and Spinal   Post-op Pain Management:    Induction:   PONV Risk Score and Plan: 4 or greater and Ondansetron, Dexamethasone, Midazolam and Propofol infusion  Airway Management Planned:   Additional Equipment:   Intra-op Plan:   Post-operative Plan:   Informed Consent: I have reviewed the patients History and Physical, chart, labs and discussed the procedure including the risks, benefits and alternatives for the proposed anesthesia with the  patient or authorized representative who has indicated his/her understanding and acceptance.   Dental Advisory Given  Plan Discussed with: CRNA  Anesthesia Plan Comments:         Anesthesia Quick Evaluation

## 2017-07-16 NOTE — Anesthesia Procedure Notes (Signed)
Date/Time: 07/16/2017 7:19 AM Performed by: Marlana Salvage Pre-anesthesia Checklist: Patient identified, Emergency Drugs available, Suction available, Patient being monitored and Timeout performed Patient Re-evaluated:Patient Re-evaluated prior to induction Oxygen Delivery Method: Nasal cannula Placement Confirmation: positive ETCO2

## 2017-07-17 ENCOUNTER — Encounter: Payer: Self-pay | Admitting: Orthopedic Surgery

## 2017-07-17 LAB — BASIC METABOLIC PANEL
Anion gap: 7 (ref 5–15)
BUN: 10 mg/dL (ref 6–20)
CHLORIDE: 97 mmol/L — AB (ref 101–111)
CO2: 31 mmol/L (ref 22–32)
Calcium: 8.8 mg/dL — ABNORMAL LOW (ref 8.9–10.3)
Creatinine, Ser: 0.73 mg/dL (ref 0.44–1.00)
GFR calc Af Amer: >60 mL/min (ref >60)
GFR calc non Af Amer: >60 mL/min (ref >60)
GLUCOSE: 143 mg/dL — AB (ref 65–99)
POTASSIUM: 3.6 mmol/L (ref 3.5–5.1)
Sodium: 135 mmol/L (ref 135–145)

## 2017-07-17 LAB — CBC
HEMATOCRIT: 37.2 % (ref 35.0–47.0)
HEMOGLOBIN: 12.7 g/dL (ref 12.0–16.0)
MCH: 31.6 pg (ref 26.0–34.0)
MCHC: 34.3 g/dL (ref 32.0–36.0)
MCV: 92.3 fL (ref 80.0–100.0)
Platelets: 235 10*3/uL (ref 150–440)
RBC: 4.03 MIL/uL (ref 3.80–5.20)
RDW: 13.7 % (ref 11.5–14.5)
WBC: 14.9 10*3/uL — ABNORMAL HIGH (ref 3.6–11.0)

## 2017-07-17 MED ORDER — TRAMADOL HCL 50 MG PO TABS: 50.0000 mg | ORAL_TABLET | ORAL | 0 refills | Status: DC | PRN

## 2017-07-17 MED ORDER — ENOXAPARIN SODIUM 30 MG/0.3ML ~~LOC~~ SOLN: 40.0000 mg | SUBCUTANEOUS | 0 refills | Status: DC

## 2017-07-17 MED ORDER — PROMETHAZINE HCL 25 MG/ML IJ SOLN
12.5000 mg | Freq: Once | INTRAMUSCULAR | Status: DC
  Filled 2017-07-17: qty 1

## 2017-07-17 MED ORDER — OXYCODONE HCL 5 MG PO TABS: 5.0000 mg | ORAL_TABLET | ORAL | 0 refills | Status: DC | PRN

## 2017-07-17 NOTE — Progress Notes (Signed)
Physical Therapy Treatment Patient Details Name: Ruth Rojas MRN: [id number] DOB: 1951-10-03 Today's Date: 07/17/2017    History of Present Illness Pt is s/p L TKA on 10/8.     PT Comments    Pt is making slow progress towards goals. Pt performed there-ex, transfers with min assist, amb with RW with min assist and chair follow for safety. Pt requires continous cuing for proper sequencing during amb, requires seated rest break every 15 feet due to decreased strength and endurance. Did not amb further due to fatigue and decreased gait quality. Discussed with pt and pt's sister about pt's current functional mobility and concerns for returning home, introduced option for SNF, understood recommendations for further services. Will continue to progress with mobility activities.    Follow Up Recommendations  SNF     Equipment Recommendations  Rolling walker with 5" wheels    Recommendations for Other Services       Precautions / Restrictions Precautions Precautions: Fall;Knee Precaution Booklet Issued: Yes (comment) Restrictions Weight Bearing Restrictions: Yes LLE Weight Bearing: Weight bearing as tolerated    Mobility  Bed Mobility               General bed mobility comments: Pt received in recliner  Transfers Overall transfer level: Needs assistance Equipment used: Rolling walker (2 wheeled) Transfers: Sit to/from Stand Sit to Stand: Min assist         General transfer comment: Pt able to rise from recliner to standing, cues for proper hand placement, min assist to rise  Ambulation/Gait Ambulation/Gait assistance: +2 safety/equipment;Min assist (chair follow) Ambulation Distance (Feet): 70 Feet Assistive device: Rolling walker (2 wheeled) Gait Pattern/deviations: Step-to pattern     General Gait Details: Pt amb with step-to gait, cues for proper LE sequencing, requires rest breaks every 15 feet, decreased stance time on LLE with further amb. Pt reported  feeling "woozy" throughout.   Stairs            Wheelchair Mobility    Modified Rankin (Stroke Patients Only)       Balance Overall balance assessment: Needs assistance Sitting-balance support: Feet supported Sitting balance-Leahy Scale: Good Sitting balance - Comments: Pt able to sit in recliner with no assist.    Standing balance support: Bilateral upper extremity supported Standing balance-Leahy Scale: Good Standing balance comment: Pt able to stand with RW with CGA, appeared to fatigue with increased standing time.                             Cognition Arousal/Alertness: Lethargic;Awake/alert Behavior During Therapy: WFL for tasks assessed/performed Overall Cognitive Status: Within Functional Limits for tasks assessed                                 General Comments: pt very sleepy for evaluation, difficult time keeping eyes open      Exercises Other Exercises Other Exercises: Pt performed ther-ex in recliner, 15x, LLE ankle pumps, hip abd/add, quad sets, heel slides. Cues for proper technique.     General Comments        Pertinent Vitals/Pain Pain Assessment: 0-10 Pain Score: 7  Pain Location: L knee Pain Descriptors / Indicators: Operative site guarding Pain Intervention(s): Limited activity within patient's tolerance;Monitored during session;Repositioned;Ice applied    Home Living Family/patient expects to be discharged to:: Private residence Living Arrangements: Alone Available Help at Discharge: Family;Available 24 hours/day (  daughter available) Type of Home: Apartment Home Access: Stairs to enter Entrance Stairs-Rails: Right Home Layout: One level Home Equipment: Shower seat      Prior Function Level of Independence: Independent      Comments: Pt independent with all ADLs and IADLs   PT Goals (current goals can now be found in the care plan section) Acute Rehab PT Goals Patient Stated Goal: to go home PT Goal  Formulation: With patient/family Time For Goal Achievement: 07/30/17 Potential to Achieve Goals: Good Progress towards PT goals: Progressing toward goals    Frequency    BID      PT Plan Discharge plan needs to be updated    Co-evaluation              AM-PAC PT "6 Clicks" Daily Activity  Outcome Measure  Difficulty turning over in bed (including adjusting bedclothes, sheets and blankets)?: None Difficulty moving from lying on back to sitting on the side of the bed? : None Difficulty sitting down on and standing up from a chair with arms (e.g., wheelchair, bedside commode, etc,.)?: Unable Help needed moving to and from a bed to chair (including a wheelchair)?: A Little Help needed walking in hospital room?: A Little Help needed climbing 3-5 steps with a railing? : A Lot 6 Click Score: 17    End of Session Equipment Utilized During Treatment: Gait belt Activity Tolerance: Patient tolerated treatment well;Patient limited by lethargy Patient left: in chair;with call bell/phone within reach;with chair alarm set;with family/visitor present   PT Visit Diagnosis: Other abnormalities of gait and mobility (R26.89);Muscle weakness (generalized) (M62.81);Pain Pain - Right/Left: Left Pain - part of body: Knee     Time: 5784-6962 PT Time Calculation (min) (ACUTE ONLY): 29 min  Charges:  $Gait Training: 8-22 mins $Therapeutic Exercise: 8-22 mins                    G Codes:  Functional Assessment Tool Used: AM-PAC 6 Clicks Basic Mobility Functional Limitation: Mobility: Walking and moving around Mobility: Walking and Moving Around Current Status (X5284): At least 40 percent but less than 60 percent impaired, limited or restricted Mobility: Walking and Moving Around Goal Status [phone]): At least 20 percent but less than 40 percent impaired, limited or restricted    Renford Dills, SPT   Renford Dills 07/17/2017, 5:30 PM

## 2017-07-17 NOTE — Progress Notes (Signed)
Clinical Social Worker (CSW) received SNF consult. PT is recommending home health. RN case manager aware of above. Please reconsult if future social work needs arise. CSW signing off.   Brandi Armato, LCSW [phone] 

## 2017-07-17 NOTE — Progress Notes (Signed)
OT Cancellation Note  Patient Details Name: KENNETH LAX MRN: [id number] DOB: Oct 09, 1951   Cancelled Treatment:    Reason Eval/Treat Not Completed: Other (comment). On 2nd attempt to evaluation, pt recently to recliner after PT session. RN noted and pt confirmed that she had nausea/vomitting during mobility earlier with PT. Pt very groggy and unable to keep eyes open while talking with OT. Will re-attempt this afternoon as appropriate.  Richrd Prime, MPH, MS, OTR/L ascom [phone] 07/17/17, 9:39 AM

## 2017-07-17 NOTE — Progress Notes (Signed)
Physical Therapy Treatment Patient Details Name: Ruth Rojas MRN: [id number] DOB: 17-Oct-1950 Today's Date: 07/17/2017    History of Present Illness Pt is s/p L TKA on 10/8.     PT Comments    Pt is making progress towards goals. Pt performed supine/seated there-ex, reviewed written HEP. Pt performed bed mobility with supervision, transfers and amb with RW with CGA, +2 assist for chair follow. Pt amb a total of 50 feet, from recliner to nurse's station with one break halfway to rest, once seated pt reported feeling nauseas and began vomiting. RN provided assistance with meds. Pt appeared limited by nausea throughout session, however motivated to participate in all PT activities. Will continue to progress with mobility activities this afternoon.    Follow Up Recommendations  Home health PT;Supervision for mobility/OOB     Equipment Recommendations  Rolling walker with 5" wheels    Recommendations for Other Services       Precautions / Restrictions Precautions Precautions: Fall;Knee Precaution Booklet Issued: Yes (comment) Restrictions Weight Bearing Restrictions: Yes LLE Weight Bearing: Weight bearing as tolerated    Mobility  Bed Mobility Overal bed mobility: Needs Assistance Bed Mobility: Supine to Sit     Supine to sit: Supervision     General bed mobility comments: Pt able to rise from supine to seated EOB with proper technique, utilized bed rails, cues to scoot hips forward to sit.   Transfers Overall transfer level: Needs assistance Equipment used: Rolling walker (2 wheeled) Transfers: Sit to/from Stand Sit to Stand: Min guard         General transfer comment: Pt able to rise from seated EOB to standing, no cues needed. Able to WB through BLEs.  Ambulation/Gait Ambulation/Gait assistance: Min guard;+2 safety/equipment (chair follow) Ambulation Distance (Feet): 50 Feet Assistive device: Rolling walker (2 wheeled) Gait Pattern/deviations: Step-to pattern     General Gait Details: Pt amb with step-to gait pattern, cues for proper sequencing, amb slowly. Pt required rest break in recliner after amb 25 ft due to fatigue, amb another 25 ft. Once seated pt began vomiting, RN provided medications for nausea and washcloth. Pt wheeled back to room.    Stairs            Wheelchair Mobility    Modified Rankin (Stroke Patients Only)       Balance Overall balance assessment: Needs assistance Sitting-balance support: Feet supported Sitting balance-Leahy Scale: Good Sitting balance - Comments: Pt able to sit at EOB with recliner with supervision. No LOB, dizziness reported.   Standing balance support: Bilateral upper extremity supported Standing balance-Leahy Scale: Good Standing balance comment: Pt able to stand at EOB with CGA, able to maintain position. No dizziness reported.                             Cognition Arousal/Alertness: Awake/alert Behavior During Therapy: WFL for tasks assessed/performed Overall Cognitive Status: Within Functional Limits for tasks assessed                                 General Comments: Pt reported feeling nauseas this morning      Exercises Total Joint Exercises Goniometric ROM: L knee AAROM 0-70 degrees Other Exercises Other Exercises: Supine ther-ex 12x, L ankle pumps, quad sets, SLRs, hip abd/add, SAQs, seated heel slides. Pt able to perform with min cues for proper technique.     General  Comments        Pertinent Vitals/Pain Pain Assessment: 0-10 Pain Score: 7  Pain Location: R knee Pain Intervention(s): Limited activity within patient's tolerance;Monitored during session;Repositioned;Ice applied    Home Living                      Prior Function            PT Goals (current goals can now be found in the care plan section) Acute Rehab PT Goals Patient Stated Goal: to return home PT Goal Formulation: With patient Time For Goal Achievement:  07/30/17 Potential to Achieve Goals: Good Progress towards PT goals: Progressing toward goals    Frequency    BID      PT Plan Current plan remains appropriate    Co-evaluation              AM-PAC PT "6 Clicks" Daily Activity  Outcome Measure  Difficulty turning over in bed (including adjusting bedclothes, sheets and blankets)?: None Difficulty moving from lying on back to sitting on the side of the bed? : None Difficulty sitting down on and standing up from a chair with arms (e.g., wheelchair, bedside commode, etc,.)?: Unable Help needed moving to and from a bed to chair (including a wheelchair)?: A Little Help needed walking in hospital room?: A Little Help needed climbing 3-5 steps with a railing? : A Lot 6 Click Score: 17    End of Session Equipment Utilized During Treatment: Gait belt Activity Tolerance: Other (comment);Patient tolerated treatment well (Pt limited by nausea) Patient left: in chair;with call bell/phone within reach;with chair alarm set;with nursing/sitter in room Nurse Communication: Mobility status PT Visit Diagnosis: Other abnormalities of gait and mobility (R26.89);Muscle weakness (generalized) (M62.81);Pain Pain - Right/Left: Left Pain - part of body: Knee     Time: 1610-9604 PT Time Calculation (min) (ACUTE ONLY): 38 min  Charges:                       G Codes:  Functional Assessment Tool Used: AM-PAC 6 Clicks Basic Mobility Functional Limitation: Mobility: Walking and moving around Mobility: Walking and Moving Around Current Status (V4098): At least 40 percent but less than 60 percent impaired, limited or restricted Mobility: Walking and Moving Around Goal Status [phone]): At least 20 percent but less than 40 percent impaired, limited or restricted    Renford Dills, SPT   Renford Dills 07/17/2017, 11:47 AM

## 2017-07-17 NOTE — Progress Notes (Signed)
OT Cancellation Note  Patient Details Name: CLELA HAGADORN MRN: [id number] DOB: 08/05/1951   Cancelled Treatment:    Reason Eval/Treat Not Completed: Patient at procedure or test/ unavailable. Order received, chart reviewed. Pt working with PT. Will re-attempt OT evaluation at later time this date as pt is available.  Richrd Prime, MPH, MS, OTR/L ascom [phone] 07/17/17, 8:57 AM

## 2017-07-17 NOTE — Clinical Social Work Note (Addendum)
Clinical Social Work Assessment  Patient Details  Name: Ruth Rojas MRN: [id number] Date of Birth: [birth year]  Date of referral:  07/17/17               Reason for consult:  Facility Placement                Permission sought to share information with:    Permission granted to share information::  Yes, Verbal Permission Granted  Name::      [place]::   [place]  Relationship::     Contact Information:     Housing/Transportation Living arrangements for the past 2 months:  [place] of Information:  Patient Patient Interpreter Needed:  None Criminal Activity/Legal Involvement Pertinent to Current Situation/Hospitalization:  No - Comment as needed Significant Relationships:  Adult Children, Other Family Members Lives with:  Self Do you feel safe going back to the place where you live?  Yes Need for family participation in patient care:  Yes (Comment)  Care giving concerns:  Patient lives alone in [place].   Social Worker assessment / plan:  Clinical Social Work and Social work Theatre manager received verbal consult that PT is changing recommending SNF. Social work Theatre manager met with patient and sister Ruth Rojas [phone]) at her bedside. Social work Theatre manager introduced herself and explained the role of the [place]. Patient was alert and oriented x4. Patient lives by herself in an apartment in [place]. Social work Theatre manager explained that PT is recommending SNF. Social work Theatre manager explained the SNF process and that Medicare requires a 3 night qualifying inpatienit stay at a hospital in order to pay for SNF. Patient was admitted to inpatient on 07/16/17. Social work Theatre manager provided a list of facilities to patient. CSW attempted to contact patient's daughter Ruth Rojas [phone]) and a voicemail was left. Patient is agreeable to SNF search in [facility] but prefers to go home. FL2 completed and faxed out. CSW and Social work Theatre manager will continue  to follow up and assist.  Employment status:  Retired Forensic scientist:  Medicare PT Recommendations:  [place] / Referral to community resources:  [place]  Patient/Family's Response to care: Patient is agreeable to [facility].   Patient/Family's Understanding of and Emotional Response to Diagnosis, Current Treatment, and Prognosis:  Patient and her sister were pleasant and thanked Social work Theatre manager for her assistance.  Emotional Assessment Appearance:  Appears stated age Attitude/Demeanor/Rapport:    Affect (typically observed):  Calm, Adaptable, Pleasant Orientation:  Oriented to Self, Oriented to Place, Oriented to  Time, Oriented to Situation Alcohol / Substance use:  Not Applicable Psych involvement (Current and /or in the community):  No (Comment)  Discharge Needs  Concerns to be addressed:  Discharge Planning Concerns Readmission within the last 30 days:  No Current discharge risk:  Dependent with Mobility Barriers to Discharge:  Continued Medical Work up   Smith Mince, Student-Social Work 07/17/2017, 4:23 PM

## 2017-07-17 NOTE — Progress Notes (Signed)
ORTHOPAEDICS PROGRESS NOTE  PATIENT NAME: Ruth Rojas DOB: 01/10/1951  MRN: [id number]  POD # 1: Left total knee arthroplasty  Subjective: The patient's daughter is in the room this morning.  The patient rested well last night. Pain is under reasonably good control. No nausea or vomiting. The patient tolerated physical therapy well yesterday afternoon, although a Physical Therapy note is not in the chart.  Objective: Vital signs in last 24 hours: Temp:  [97.4 F (36.3 C)-98.2 F (36.8 C)] 98.2 F (36.8 C) (10/09 0400) Pulse Rate:  [60-78] 78 (10/09 0400) Resp:  [10-18] 16 (10/09 0028) BP: (123-144)/(51-96) 133/51 (10/09 0400) SpO2:  [87 %-97 %] 91 % (10/09 0400)  Intake/Output from previous day: 10/08 0701 - 10/09 0700 In: 1543.3 [P.O.:240; I.V.:803.3; IV Piggyback:500] Out: 2200 [Urine:1930; Drains:220; Blood:50]   Recent Labs  07/17/17 0547  WBC 14.9*  HGB 12.7  HCT 37.2  PLT 235  K 3.6  CL 97*  CO2 31  BUN 10  CREATININE 0.73  GLUCOSE 143*  CALCIUM 8.8*    EXAM General: Well-developed well-nourished female in no apparent discomfort. Lungs: clear to auscultation Cardiac: normal rate, regular rhythm, normal S1, S2, no murmurs, rubs, clicks or gallops, normal rate and regular rhythm Left lower extremity: Dressing is dry and intact. Hemovac drain is in place. Bone foam and Polar Care are in place. The patient is able to perform an independent straight leg raise with minimal assistance. Homans test is negative. Neurologic: Awake, alert, and oriented. Sensory and motor function are grossly intact.  Assessment: Left total knee arthroplasty  Secondary diagnoses: Asthma Hypertension Diverticulosis  Plan: Today's goal were reviewed with the patient.  Continue with physical therapy and occupational therapy as per total knee arthroplasty rehabilitation protocol. Plan is to go Home after hospital stay. DVT Prophylaxis - Lovenox, Foot Pumps and TED hose  Pang Robers  P. Angie Fava M.D.

## 2017-07-17 NOTE — Evaluation (Signed)
Occupational Therapy Evaluation Patient Details Name: Ruth Rojas MRN: [id number] DOB: 1951-08-20 Today's Date: 07/17/2017    History of Present Illness Pt is s/p L TKA on 10/8.    Clinical Impression   Pt is 66 year old female s/p L TKR.  Pt was independent in all ADLs prior to surgery and is eager to return to PLOF. Pt lives in an apartment by herself with 4+6+6 steps to enter. Pt currently requires moderate assist for LB dressing while in seated position due to pain and limited AROM of L knee. Limited by nausea and vomiting this morning, decreased strength in LLE, pain, and poor activity tolerance. Pt would benefit from instruction in dressing techniques with or without assistive devices for dressing and bathing skills as well as functional transfer training and recommendations for home modifications to increase safety in the bathroom and prevent falls. Recommend STR prior to safe return home given pt's functional status and inability to safely enter/exit home.       Follow Up Recommendations  SNF    Equipment Recommendations  3 in 1 bedside commode;Other (comment) (reacher, sock aid)    Recommendations for Other Services       Precautions / Restrictions Precautions Precautions: Fall;Knee Precaution Booklet Issued: No Restrictions Weight Bearing Restrictions: Yes LLE Weight Bearing: Weight bearing as tolerated      Mobility Bed Mobility               General bed mobility comments: deferred, pt up in recliner for session  Transfers Overall transfer level: Needs assistance Equipment used: Rolling walker (2 wheeled) Transfers: Stand Pivot Transfers Sit to Stand: Min assist         General transfer comment: min assist for SPT to bedside commode, any additional distance would require chair follow for safety    Balance Overall balance assessment: Needs assistance Sitting-balance support: Feet supported Sitting balance-Leahy Scale: Good     Standing balance  support: Bilateral upper extremity supported Standing balance-Leahy Scale: Good                             ADL either performed or assessed with clinical judgement   ADL Overall ADL's : Needs assistance/impaired Eating/Feeding: Sitting;Set up   Grooming: Sitting;Set up   Upper Body Bathing: Sitting;Set up   Lower Body Bathing: Sitting/lateral leans;Moderate assistance Lower Body Bathing Details (indicate cue type and reason): pt educated in [facility] sponge use and shower chair for seated shower to maximize safety. Pt verbalized understanding. Upper Body Dressing : Sitting;Set up;Supervision/safety   Lower Body Dressing: Sitting/lateral leans;Sit to/from stand;Moderate assistance;With adaptive equipment Lower Body Dressing Details (indicate cue type and reason): Pt educated in use of AE for LB dressing tasks. Pt verbalized understanding. Too tired to attempt this date, will trial next date. Toilet Transfer: Minimal assistance;BSC;Stand-pivot;RW                   Vision Baseline Vision/History: Wears glasses Wears Glasses: At all times Patient Visual Report: No change from baseline Vision Assessment?: No apparent visual deficits     Perception     Praxis      Pertinent Vitals/Pain Pain Assessment: 0-10 Pain Score: 7  Pain Location: L knee Pain Descriptors / Indicators: Operative site guarding Pain Intervention(s): Limited activity within patient's tolerance;Monitored during session;Ice applied;Premedicated before session     Hand Dominance     Extremity/Trunk Assessment Upper Extremity Assessment Upper Extremity Assessment: Overall WFL for  tasks assessed   Lower Extremity Assessment Lower Extremity Assessment: Defer to PT evaluation;Generalized weakness   Cervical / Trunk Assessment Cervical / Trunk Assessment: Normal   Communication Communication Communication: No difficulties   Cognition Arousal/Alertness: Awake/alert;Lethargic Behavior During  Therapy: WFL for tasks assessed/performed Overall Cognitive Status: Within Functional Limits for tasks assessed                                 General Comments: pt very sleepy for evaluation, difficult time keeping eyes open   General Comments       Exercises Other Exercises Other Exercises: Pt educated in polar care, compression stocking mgt, home/routines modifications, AE/DME for self care tasks, and falls prevention strategies to support maximal return to PLOF and minimize risk of falls. Pt verbalized understanding.   Shoulder Instructions      Home Living Family/patient expects to be discharged to:: Private residence Living Arrangements: Alone Available Help at Discharge: Family;Available 24 hours/day (daughter available) Type of Home: Apartment Home Access: Stairs to enter Entrance Stairs-Number of Steps: 4 steps, landing, 6 and another 6 steps Entrance Stairs-Rails: Right Home Layout: One level     Bathroom Shower/Tub: Producer, television/film/video: Handicapped height     Home Equipment: Shower seat          Prior Functioning/Environment Level of Independence: Independent        Comments: Pt independent with all ADLs and IADLs        OT Problem List: Decreased strength;Decreased range of motion;Decreased activity tolerance;Decreased safety awareness;Decreased knowledge of use of DME or AE      OT Treatment/Interventions: Self-care/ADL training;Therapeutic exercise;Therapeutic activities;DME and/or AE instruction;Patient/family education;Energy conservation    OT Goals(Current goals can be found in the care plan section) Acute Rehab OT Goals Patient Stated Goal: get better OT Goal Formulation: With patient/family Time For Goal Achievement: 07/31/17 Potential to Achieve Goals: Good ADL Goals Pt Will Perform Lower Body Dressing: with set-up;with supervision;with adaptive equipment Pt Will Transfer to Toilet: with min guard  assist;ambulating (comfort height toilet, RW for ambulation)  OT Frequency: Min 1X/week   Barriers to D/C: Inaccessible home environment;Decreased caregiver support          Co-evaluation              AM-PAC PT "6 Clicks" Daily Activity     Outcome Measure Help from another person eating meals?: A Little Help from another person taking care of personal grooming?: A Little Help from another person toileting, which includes using toliet, bedpan, or urinal?: A Little Help from another person bathing (including washing, rinsing, drying)?: A Lot Help from another person to put on and taking off regular upper body clothing?: A Little Help from another person to put on and taking off regular lower body clothing?: A Lot 6 Click Score: 16   End of Session    Activity Tolerance: Patient limited by fatigue Patient left: in chair;with call bell/phone within reach;with chair alarm set;with family/visitor present;Other (comment) (polar care in place)  OT Visit Diagnosis: Other abnormalities of gait and mobility (R26.89)                Time: 9528-4132 OT Time Calculation (min): 20 min Charges:  OT General Charges $OT Visit: 1 Visit OT Evaluation $OT Eval Low Complexity: 1 Low OT Treatments $Self Care/Home Management : 8-22 mins G-Codes: OT G-codes **NOT FOR INPATIENT CLASS** Functional Assessment Tool Used: AM-PAC  6 Clicks Daily Activity;Clinical judgement Functional Limitation: Self care Self Care Current Status [phone]): At least 40 percent but less than 60 percent impaired, limited or restricted Self Care Goal Status (W0981): At least 20 percent but less than 40 percent impaired, limited or restricted   Richrd Prime, MPH, MS, OTR/L ascom [phone] 07/17/17, 4:16 PM

## 2017-07-17 NOTE — Clinical Social Work Placement (Signed)
   CLINICAL SOCIAL WORK PLACEMENT  NOTE  Date:  07/17/2017  Patient Details  Name: Ruth Rojas MRN: [id number] Date of Birth: 09/25/1951  Clinical Social Work is seeking post-discharge placement for this patient at the Skilled  Nursing Facility level of care (*CSW will initial, date and re-position this form in  chart as items are completed):  Yes   Patient/family provided with [place] Clinical Social Work Department's list of facilities offering this level of care within the geographic area requested by the patient (or if unable, by the patient's family).  Yes   Patient/family informed of their freedom to choose among providers that offer the needed level of care, that participate in Medicare, Medicaid or managed care program needed by the patient, have an available bed and are willing to accept the patient.  Yes   Patient/family informed of [facility]'s ownership interest in [facility] and [facility], as well as of the fact that they are under no obligation to receive care at these facilities.  PASRR submitted to EDS on 07/17/17     PASRR number received on 07/17/17     Existing PASRR number confirmed on       FL2 transmitted to all facilities in geographic area requested by pt/family on 07/17/17     FL2 transmitted to all facilities within larger geographic area on       Patient informed that his/her managed care company has contracts with or will negotiate with certain facilities, including the following:            Patient/family informed of bed offers received.  Patient chooses bed at       Physician recommends and patient chooses bed at      Patient to be transferred to   on  .  Patient to be transferred to facility by       Patient family notified on   of transfer.  Name of family member notified:        PHYSICIAN       Additional Comment:    _______________________________________________ Lacey Wallman, Darleen Crocker, LCSW 07/17/2017, 4:21 PM

## 2017-07-17 NOTE — Care Management Note (Signed)
Case Management Note  Patient Details  Name: Ruth Rojas MRN: [id number] Date of Birth: 12-May-1951  Subjective/Objective:  RNCM assessment for discharge planning. Met with patient . She is sitting up in chair at bedside. Very sleepy after receiving medication for nausea. Patient lives alone but her daughter will be staying with her. She has a walker. Offered choice of home health agencies with referral made to [facility] for HHPT. [facility] [place] [phone]. Will call Lovenox at discharge. PCP is Ruth Rojas. .                    Action/Plan: [facility] for HHPT.   Expected Discharge Date:                  Expected Discharge Plan:  [name]  In-House Referral:     Discharge planning Services  CM Consult  Post Acute Care Choice:  Home Health Choice offered to:  Patient  DME Arranged:    DME Agency:     HH Arranged:  PT [name]:  [name]  Status of Service:  In process, will continue to follow  If discussed at Long Length of Stay Meetings, dates discussed:    Additional Comments:  Jolly Mango, RN 07/17/2017, 9:28 AM

## 2017-07-17 NOTE — Discharge Summary (Signed)
Physician Discharge Summary  Patient ID: Ruth Rojas MRN: [id number] DOB/AGE: Apr 29, 1951 66 y.o.  Admit date: 07/16/2017 Discharge date: 07/18/2017  Admission Diagnoses:  OSTEOARTHRITISOF LEFT KNEE   Discharge Diagnoses: Patient Active Problem List   Diagnosis Date Noted  . 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 Arthritis   . Asthma   . Diverticulosis   . Hypertension      Transfusion: no transfusion during this admission   Consultants (if any):   Discharged Condition: Improved  Hospital Course: CASSIE SHEDLOCK is an 66 y.o. female who was admitted 07/16/2017 with a diagnosis of degenerative arthrosis left knee and went to the operating room on 07/16/2017 and underwent the above named procedures.    Surgeries:Procedure(s): COMPUTER ASSISTED TOTAL KNEE ARTHROPLASTY on 07/16/2017  PRE-OPERATIVE DIAGNOSIS: Degenerative arthrosis of the left knee, primary  POST-OPERATIVE DIAGNOSIS:  Same  PROCEDURE:  Left total knee arthroplasty using computer-assisted navigation  SURGEON:  Jena Gauss. M.D.  ASSISTANT:  Van Clines, PA (present and scrubbed throughout the case, critical for assistance with exposure, retraction, instrumentation, and closure)  ANESTHESIA: spinal  ESTIMATED BLOOD LOSS: 50 mL  FLUIDS REPLACED: 600 mL of crystalloid  TOURNIQUET TIME: 100 minutes  DRAINS: 2 medium Hemovac drains  SOFT TISSUE RELEASES: Anterior cruciate ligament, posterior cruciate ligament, deep medial collateral ligament, patellofemoral ligament  IMPLANTS UTILIZED: DePuy Attune size 6N posterior stabilized femoral component (cemented), size 4 rotating platform tibial component (cemented), 35 mm medialized dome patella (cemented), and a 5 mm stabilized rotating platform polyethylene insert.  INDICATIONS FOR SURGERY: Ruth Rojas is a 66 y.o. year old female with a long history of progressive knee pain. X-rays demonstrated  severe degenerative changes in tricompartmental fashion. The patient had not seen any significant improvement despite conservative nonsurgical intervention. After discussion of the risks and benefits of surgical intervention, the patient expressed understanding of the risks benefits and agree with plans for total knee arthroplasty.   The risks, benefits, and alternatives were discussed at length including but not limited to the risks of infection, bleeding, nerve injury, stiffness, blood clots, the need for revision surgery, cardiopulmonary complications, among others, and they were willing to proceed. Patient tolerated the surgery well. No complications .Patient was taken to PACU where she was stabilized and then transferred to the orthopedic floor.  Patient started on Lovenox 30 q 12 hrs. Foot pumps applied bilaterally at 80 mm hgb. Heels elevated off bed with rolled towels. No evidence of DVT. Calves non tender. Negative Homan. Physical therapy started on day #1 for gait training and transfer with OT starting on  day #1 for ADL and assisted devices. Patient has done well with therapy. Ambulated greater than 200 feet upon being discharged. Able to ascend and descend 4 step safely and independently   Patient's IV And Foley were discontinued on day #1 with Hemovac being discontinued on day #2. Dressing was changed on day 2 prior to patient being discharged   She was given perioperative antibiotics:  Anti-infectives    Start     Dose/Rate Route Frequency Ordered Stop   07/16/17 1400  ceFAZolin (ANCEF) 2 g in dextrose 5 % 100 mL IVPB     2 g 200 mL/hr over 30 Minutes Intravenous Every 6 hours 07/16/17 1247 07/17/17 1359   07/16/17 1245  ceFAZolin (ANCEF) IVPB 2g/100 mL premix  Status:  Discontinued     2 g 200 mL/hr over 30 Minutes Intravenous Every  6 hours 07/16/17 1238 07/16/17 1247   07/16/17 0601  ceFAZolin (ANCEF) 2-4 GM/100ML-% IVPB    Comments:  Register, Karen   : cabinet override       07/16/17 0601 07/16/17 0728   07/16/17 0600  ceFAZolin (ANCEF) IVPB 2g/100 mL premix     2 g 200 mL/hr over 30 Minutes Intravenous On call to O.R. 07/15/17 2132 07/16/17 0735    .  She was fitted with AV 1 compression foot pump devices, instructed on heel pumps, early ambulation, and fitted with TED stockings bilaterally for DVT prophylaxis.  She benefited maximally from the hospital stay and there were no complications.    Recent vital signs:  Vitals:   07/17/17 0400 07/17/17 0722  BP: (!) 133/51 118/67  Pulse: 78 75  Resp:    Temp: 98.2 F (36.8 C) 98.1 F (36.7 C)  SpO2: 91% 96%    Recent laboratory studies:  Lab Results  Component Value Date   HGB 12.7 07/17/2017   HGB 15.2 07/04/2017   Lab Results  Component Value Date   WBC 14.9 (H) 07/17/2017   PLT 235 07/17/2017   Lab Results  Component Value Date   INR 0.87 07/04/2017   Lab Results  Component Value Date   NA 135 07/17/2017   K 3.6 07/17/2017   CL 97 (L) 07/17/2017   CO2 31 07/17/2017   BUN 10 07/17/2017   CREATININE 0.73 07/17/2017   GLUCOSE 143 (H) 07/17/2017    Discharge Medications:   Allergies as of 07/17/2017      Reactions   Codeine Nausea And Vomiting   Constipation   Gentamicin Other (See Comments)   Eyedrops caused red, irritated and dry eyes   Bisacodyl Rash   Chlorhexidine Rash   Clarithromycin Rash   Sulfa Antibiotics Rash      Medication List    STOP taking these medications   aspirin 81 MG chewable tablet     TAKE these medications   acetaminophen 650 MG CR tablet Commonly known as:  TYLENOL Take 1,300 mg by mouth every 8 (eight) hours as needed for pain.   albuterol 108 (90 Base) MCG/ACT inhaler Commonly known as:  PROVENTIL HFA;VENTOLIN HFA Inhale 2 puffs into the lungs every 6 (six) hours as needed for wheezing or shortness of breath.   ARNUITY ELLIPTA 200 MCG/ACT Aepb Generic drug:  Fluticasone Furoate Inhale 1 puff into the lungs daily.   ARTIFICIAL TEAR  OP Place 1 drop into both eyes 2 (two) times daily as needed (dry eyes).   BENEFIBER ON THE GO Powd Take by mouth 2 (two) times daily.   Biotin Plus Keratin 10000-100 MCG-MG Tabs Take 1 tablet by mouth daily.   docusate sodium 100 MG capsule Commonly known as:  COLACE Take 100 mg by mouth 2 (two) times daily.   enoxaparin 30 MG/0.3ML injection Commonly known as:  LOVENOX Inject 0.4 mLs (40 mg total) into the skin daily.   hydrochlorothiazide 12.5 MG capsule Commonly known as:  MICROZIDE Take 12.5 mg by mouth daily.   loratadine 10 MG tablet Commonly known as:  CLARITIN Take 10 mg by mouth daily as needed for allergies.   Melatonin 3 MG Tabs Take 1.5 mg by mouth at bedtime.   montelukast 10 MG tablet Commonly known as:  SINGULAIR Take 10 mg by mouth at bedtime.   multivitamin with minerals Tabs tablet Take 1 tablet by mouth daily with supper.   oxyCODONE 5 MG immediate release tablet Commonly known  as:  Oxy IR/ROXICODONE Take 1-2 tablets (5-10 mg total) by mouth every 4 (four) hours as needed for severe pain or breakthrough pain.   Potassium 99 MG Tabs Take 99 mg by mouth daily.   traMADol 50 MG tablet Commonly known as:  ULTRAM Take 1-2 tablets (50-100 mg total) by mouth every 4 (four) hours as needed for moderate pain.   vitamin C 500 MG tablet Commonly known as:  ASCORBIC ACID Take 1,500 mg by mouth daily.   vitamin E 400 UNIT capsule Take 400 Units by mouth daily.            Durable Medical Equipment        Start     Ordered   07/16/17 1239  DME Walker rolling  Once    Question:  Patient needs a walker to treat with the following condition  Answer:  Total knee replacement status   07/16/17 1238   07/16/17 1239  DME Bedside commode  Once    Question:  Patient needs a bedside commode to treat with the following condition  Answer:  Total knee replacement status   07/16/17 1238      Diagnostic Studies: Dg Knee Left Port  Result Date:  07/16/2017 CLINICAL DATA:  Status post left knee arthroplasty. EXAM: PORTABLE LEFT KNEE - 1-2 VIEW COMPARISON:  None. FINDINGS: The femoral and tibial prosthetic components appear well seated and well aligned. There is no acute fracture or evidence of an operative complication. IMPRESSION: Well-positioned total left knee arthroplasty. Electronically Signed   By: Amie Portland M.D.   On: 07/16/2017 11:43   Mm Screening Breast Tomo Bilateral  Result Date: 06/27/2017 CLINICAL DATA:  Screening. EXAM: 2D DIGITAL SCREENING BILATERAL MAMMOGRAM WITH CAD AND ADJUNCT TOMO COMPARISON:  None. ACR Breast Density Category b: There are scattered areas of fibroglandular density. FINDINGS: There are no findings suspicious for malignancy. Images were processed with CAD. IMPRESSION: No mammographic evidence of malignancy. A result letter of this screening mammogram will be mailed directly to the patient. RECOMMENDATION: Screening mammogram in one year. (Code:[id number]-B-01Y) BI-RADS CATEGORY  1: Negative. Electronically Signed   By: Baird Lyons M.D.   On: 06/27/2017 09:25    Disposition: Final discharge disposition not confirmed  Discharge Instructions    Diet - low sodium heart healthy    Complete by:  As directed    Increase activity slowly    Complete by:  As directed       Follow-up Information    Tera Partridge, PA On 07/31/2017.   Specialty:  Physician Assistant Why:  at 10:15am Contact information: [address] [facility] [place] [address] [phone]        Donato Heinz, MD On 08/28/2017.   Specialty:  Orthopedic Surgery Why:  at 9:15am Contact information: [address] [facility] [place] [address] [phone]            Signed: Tera Partridge. 07/17/2017, 7:31 AM

## 2017-07-17 NOTE — [place] FL2 (Signed)
[place] MEDICAID FL2 LEVEL OF CARE SCREENING TOOL     IDENTIFICATION  Patient Name: Ruth Rojas Birthdate: 06/03/1951 Sex: female Admission Date (Current Location): 07/16/2017  [place] and [place] Number:  Chiropodist and Address:  [facility], [address] [place], [place] [address]      Provider Number: [id number]  Attending Physician Name and Address:  Donato Heinz, MD  Relative Name and Phone Number:       Current Level of Care: Hospital Recommended Level of Care: Skilled Nursing Facility Prior Approval Number:    Date Approved/Denied:   PASRR Number:   [id number]  Discharge Plan: SNF    Current Diagnoses: Patient Active Problem List   Diagnosis Date Noted  . 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    Orientation RESPIRATION BLADDER Height & Weight     Self, Time, Situation, Place  Normal Continent Weight: 208 lb (94.3 kg) Height:   (157.5 cm)  BEHAVIORAL SYMPTOMS/MOOD NEUROLOGICAL BOWEL NUTRITION STATUS      Continent Diet (Regular)  AMBULATORY STATUS COMMUNICATION OF NEEDS Skin   Extensive Assist Verbally Surgical wounds (Incision (Left Knee))                       Personal Care Assistance Level of Assistance  Bathing, Feeding, Dressing Bathing Assistance: Limited assistance Feeding assistance: Independent Dressing Assistance: Limited assistance     Functional Limitations Info  Sight, Hearing, Speech Sight Info: Impaired Hearing Info: Adequate Speech Info: Adequate    SPECIAL CARE FACTORS FREQUENCY  PT (By licensed PT), OT (By licensed OT)     PT Frequency:  (5) OT Frequency:  (5)            Contractures      Additional Factors Info  Code Status, Allergies Code Status Info:  (Full Code) Allergies Info:  (CODEINE, GENTAMICIN, BISACODYL, CHLORHEXIDINE, CLARITHROMYCIN, SULFA ANTIBIOTICS )           Current Medications (07/17/2017):  This is the current hospital active medication  list Current Facility-Administered Medications  Medication Dose Route Frequency Provider Last Rate Last Dose  . 0.9 %  sodium chloride infusion   Intravenous Continuous Hooten, Illene Labrador, MD 100 mL/hr at 07/16/17 2352    . acetaminophen (TYLENOL) tablet 650 mg  650 mg Oral Q6H PRN Hooten, Illene Labrador, MD       Or  . acetaminophen (TYLENOL) suppository 650 mg  650 mg Rectal Q6H PRN Hooten, Illene Labrador, MD      . albuterol (PROVENTIL) (2.5 MG/3ML) 0.083% nebulizer solution 2.5 mg  2.5 mg Nebulization Q6H PRN Hooten, Illene Labrador, MD      . alum & mag hydroxide-simeth (MAALOX/MYLANTA) 200-200-20 MG/5ML suspension 30 mL  30 mL Oral Q4H PRN Hooten, Illene Labrador, MD      . budesonide (PULMICORT) nebulizer solution 0.5 mg  0.5 mg Nebulization BID PRN Hooten, Illene Labrador, MD      . diphenhydrAMINE (BENADRYL) 12.5 MG/5ML elixir 12.5-25 mg  12.5-25 mg Oral Q4H PRN Hooten, Illene Labrador, MD      . enoxaparin (LOVENOX) injection 30 mg  30 mg Subcutaneous Q12H Hooten, Illene Labrador, MD   30 mg at 07/17/17 0800  . ferrous sulfate tablet 325 mg  325 mg Oral BID WC Hooten, Illene Labrador, MD   325 mg at 07/17/17 8469  . hydrochlorothiazide (MICROZIDE) capsule 12.5 mg  12.5 mg Oral Daily Donato Heinz, MD   12.5 mg at 07/16/17  1342  . loratadine (CLARITIN) tablet 10 mg  10 mg Oral Daily PRN Hooten, Illene Labrador, MD      . magnesium hydroxide (MILK OF MAGNESIA) suspension 30 mL  30 mL Oral Daily PRN Donato Heinz, MD   30 mL at 07/16/17 2042  . menthol-cetylpyridinium (CEPACOL) lozenge 3 mg  1 lozenge Oral PRN Hooten, Illene Labrador, MD       Or  . phenol (CHLORASEPTIC) mouth spray 1 spray  1 spray Mouth/Throat PRN Hooten, Illene Labrador, MD      . metoCLOPramide (REGLAN) tablet 10 mg  10 mg Oral TID AC & HS Hooten, Illene Labrador, MD   10 mg at 07/17/17 1610  . montelukast (SINGULAIR) tablet 10 mg  10 mg Oral QHS Hooten, Illene Labrador, MD   10 mg at 07/16/17 2032  . morphine 2 MG/ML injection 2 mg  2 mg Intravenous Q2H PRN Hooten, Illene Labrador, MD   2 mg at 07/16/17 2257  .  multivitamin with minerals tablet 1 tablet  1 tablet Oral Q supper Hooten, Illene Labrador, MD   1 tablet at 07/16/17 1626  . ondansetron (ZOFRAN) tablet 4 mg  4 mg Oral Q6H PRN Hooten, Illene Labrador, MD       Or  . ondansetron (ZOFRAN) injection 4 mg  4 mg Intravenous Q6H PRN Hooten, Illene Labrador, MD      . oxyCODONE (Oxy IR/ROXICODONE) immediate release tablet 5-10 mg  5-10 mg Oral Q4H PRN Donato Heinz, MD   10 mg at 07/17/17 0558  . pantoprazole (PROTONIX) EC tablet 40 mg  40 mg Oral BID Donato Heinz, MD   40 mg at 07/16/17 2032  . polycarbophil (FIBERCON) tablet 625 mg  625 mg Oral BID Hooten, Illene Labrador, MD      . polyvinyl alcohol (LIQUIFILM TEARS) 1.4 % ophthalmic solution 1 drop  1 drop Both Eyes TID PRN Hooten, Illene Labrador, MD      . Potassium TABS 99 mg  99 mg Oral Daily Hooten, Illene Labrador, MD      . senna-docusate (Senokot-S) tablet 1 tablet  1 tablet Oral BID Donato Heinz, MD   1 tablet at 07/16/17 2037  . sodium phosphate (FLEET) 7-19 GM/118ML enema 1 enema  1 enema Rectal Once PRN Hooten, Illene Labrador, MD      . sorbitol 70 % solution 30 mL  30 mL Oral Daily PRN Hooten, Illene Labrador, MD      . traMADol Janean Sark) tablet 50-100 mg  50-100 mg Oral Q4H PRN Donato Heinz, MD   100 mg at 07/17/17 9604  . vitamin C (ASCORBIC ACID) tablet 1,500 mg  1,500 mg Oral Daily Hooten, Illene Labrador, MD      . vitamin E capsule 400 Units  400 Units Oral Daily Hooten, Illene Labrador, MD         Discharge Medications: Please see discharge summary for a list of discharge medications.  Relevant Imaging Results:  Relevant Lab Results:   Additional Information  SSN: ([id number])  Payton Spark, Student-Social Work

## 2017-07-18 LAB — CBC
HEMATOCRIT: 35.1 % (ref 35.0–47.0)
Hemoglobin: 12.2 g/dL (ref 12.0–16.0)
MCH: 31.7 pg (ref 26.0–34.0)
MCHC: 34.8 g/dL (ref 32.0–36.0)
MCV: 91.1 fL (ref 80.0–100.0)
Platelets: 196 10*3/uL (ref 150–440)
RBC: 3.85 MIL/uL (ref 3.80–5.20)
RDW: 13.4 % (ref 11.5–14.5)
WBC: 14.4 10*3/uL — AB (ref 3.6–11.0)

## 2017-07-18 LAB — BASIC METABOLIC PANEL
ANION GAP: 9 (ref 5–15)
BUN: 8 mg/dL (ref 6–20)
CO2: 32 mmol/L (ref 22–32)
Calcium: 8.9 mg/dL (ref 8.9–10.3)
Chloride: 88 mmol/L — ABNORMAL LOW (ref 101–111)
Creatinine, Ser: 0.66 mg/dL (ref 0.44–1.00)
GFR calc Af Amer: >60 mL/min (ref >60)
GFR calc non Af Amer: >60 mL/min (ref >60)
GLUCOSE: 159 mg/dL — AB (ref 65–99)
POTASSIUM: 3.7 mmol/L (ref 3.5–5.1)
Sodium: 129 mmol/L — ABNORMAL LOW (ref 135–145)

## 2017-07-18 MED ORDER — LACTULOSE 10 GM/15ML PO SOLN
10.0000 g | Freq: Two times a day (BID) | ORAL | Status: DC | PRN
  Administered 2017-07-18: 10 g via ORAL
  Filled 2017-07-18: qty 30

## 2017-07-18 MED ORDER — ENOXAPARIN SODIUM 40 MG/0.4ML ~~LOC~~ SOLN
40.0000 mg | SUBCUTANEOUS | Status: DC
  Administered 2017-07-18: 40 mg via SUBCUTANEOUS
  Filled 2017-07-18: qty 0.4

## 2017-07-18 MED ORDER — ENOXAPARIN SODIUM 40 MG/0.4ML ~~LOC~~ SOLN: 40.0000 mg | SUBCUTANEOUS | 0 refills | Status: DC

## 2017-07-18 MED ORDER — HYDROCODONE-ACETAMINOPHEN 5-325 MG PO TABS
1.0000 | ORAL_TABLET | ORAL | Status: DC | PRN
  Administered 2017-07-18 (×2): 2 via ORAL
  Filled 2017-07-18 (×2): qty 2

## 2017-07-18 MED ORDER — HYDROCODONE-ACETAMINOPHEN 5-325 MG PO TABS: 1.0000 | ORAL_TABLET | ORAL | 0 refills | Status: DC | PRN

## 2017-07-18 NOTE — Progress Notes (Signed)
Patient has had a bowel movement. Patient have no complaints or SS of distress. Patient will be discharging after PT eval this evening.

## 2017-07-18 NOTE — Care Management (Addendum)
Patient is a new patient at [facility]. The appropriate insurance card is not on file. Patient will have to present card at pick up. Prescription is ready Ordered a bsc from [facility].

## 2017-07-18 NOTE — Progress Notes (Signed)
Physical Therapy Treatment Patient Details Name: Ruth Rojas MRN: [id number] DOB: 1951-08-04 Today's Date: 07/18/2017    History of Present Illness Pt is s/p L TKA on 10/8.     PT Comments    Pt is making good progress towards goals. Pt performed transfers and amb with RW with min assist, and stairs with mod assist. Pt amb a total of 130 ft, required rest break every 40 ft, required fewer cues for sequencing. Pt educated on proper technique for using stairs, pt able to climb 4 steps x 2 with rest break, demonstrated improved technique with second attempt. Pt states feeling safe with ability to climb steps. Pt appeared motivated to participate in PT, sister and daughter were present and provided frequent encouragement.    Follow Up Recommendations  Home health PT;Supervision/Assistance - 24 hour     Equipment Recommendations  Rolling walker with 5" wheels    Recommendations for Other Services       Precautions / Restrictions Precautions Precautions: Fall;Knee Precaution Booklet Issued: Yes (comment) Restrictions Weight Bearing Restrictions: Yes LLE Weight Bearing: Weight bearing as tolerated    Mobility  Bed Mobility               General bed mobility comments: Pt received in recliner  Transfers Overall transfer level: Needs assistance Equipment used: Rolling walker (2 wheeled) Transfers: Sit to/from Stand Sit to Stand: Min assist         General transfer comment: pt able to remember proper hand placement without cues, min assist to rise  Ambulation/Gait Ambulation/Gait assistance: Min assist;+2 safety/equipment (chair follow) Ambulation Distance (Feet): 130 Feet Assistive device: Rolling walker (2 wheeled) Gait Pattern/deviations: Step-through pattern;Step-to pattern     General Gait Details: Pt amb with step-to gait, transitioned to step-through gait pattern, occasional cues for proper technique. Pt amb slowly with short steps, requires rest break every  40 feet   Stairs Stairs: Yes   Stair Management: Two rails;Step to pattern Number of Stairs: 4 General stair comments: Pt educated on proper technique with stairs, provided demonstration. Pt performed 4 steps up/down 2x with one seated break. On first attempt pt held onto R rail with both hands, cues to lift LLE higher to clear step with foot, required mod assistance, pt relies heavily on rail for support. On second attempt, pt able to perform with improved technique and greater control. Still requires cues for proper sequencing. Pt appears safer to perform stairs when going down with rail on R vs L.   Wheelchair Mobility    Modified Rankin (Stroke Patients Only)       Balance Overall balance assessment: Needs assistance Sitting-balance support: Feet supported Sitting balance-Leahy Scale: Good Sitting balance - Comments: pt able to sit in recliner, no assist   Standing balance support: Bilateral upper extremity supported Standing balance-Leahy Scale: Good Standing balance comment: pt able to stand with RW with CGA                            Cognition Arousal/Alertness: Awake/alert Behavior During Therapy: WFL for tasks assessed/performed Overall Cognitive Status: Within Functional Limits for tasks assessed                                        Exercises Other Exercises Other Exercises: Reviewed education on compression stocking mgt and polar care mgt with pt/dtr. Both  verbalized understanding.    General Comments        Pertinent Vitals/Pain Pain Assessment: 0-10 Pain Score: 5  Pain Location: L knee Pain Intervention(s): Limited activity within patient's tolerance;Monitored during session;Repositioned;Ice applied    Home Living                      Prior Function            PT Goals (current goals can now be found in the care plan section) Acute Rehab PT Goals Patient Stated Goal: to go home PT Goal Formulation: With  patient Time For Goal Achievement: 07/30/17 Potential to Achieve Goals: Good Progress towards PT goals: Progressing toward goals    Frequency    BID      PT Plan Current plan remains appropriate    Co-evaluation              AM-PAC PT "6 Clicks" Daily Activity  Outcome Measure  Difficulty turning over in bed (including adjusting bedclothes, sheets and blankets)?: None Difficulty moving from lying on back to sitting on the side of the bed? : None Difficulty sitting down on and standing up from a chair with arms (e.g., wheelchair, bedside commode, etc,.)?: Unable Help needed moving to and from a bed to chair (including a wheelchair)?: A Little Help needed walking in hospital room?: A Little Help needed climbing 3-5 steps with a railing? : A Lot 6 Click Score: 17    End of Session Equipment Utilized During Treatment: Gait belt Activity Tolerance: Patient tolerated treatment well Patient left: in chair;with call bell/phone within reach;with nursing/sitter in room Nurse Communication: Mobility status PT Visit Diagnosis: Other abnormalities of gait and mobility (R26.89);Muscle weakness (generalized) (M62.81);Pain Pain - Right/Left: Left Pain - part of body: Knee     Time: 1610-9604 PT Time Calculation (min) (ACUTE ONLY): 24 min  Charges:                       G Codes:  Functional Assessment Tool Used: AM-PAC 6 Clicks Basic Mobility Functional Limitation: Mobility: Walking and moving around Mobility: Walking and Moving Around Current Status (V4098): At least 40 percent but less than 60 percent impaired, limited or restricted Mobility: Walking and Moving Around Goal Status [phone]): At least 20 percent but less than 40 percent impaired, limited or restricted    Renford Dills, SPT   Renford Dills 07/18/2017, 4:27 PM

## 2017-07-18 NOTE — Care Management Important Message (Addendum)
Important Message  Patient Details  Name: Ruth Rojas MRN: [id number] Date of Birth: [birth year]   Medicare Important Message Given: Yes   Marily Memos, RN 07/18/2017, 8:27 AM

## 2017-07-18 NOTE — Progress Notes (Signed)
Physical Therapy Treatment Patient Details Name: Ruth Rojas MRN: [id number] DOB: June 06, 1951 Today's Date: 07/18/2017    History of Present Illness Pt is s/p L TKA on 10/8.     PT Comments    Pt is making progress towards goals. Pt performed there-ex, bed mobility with supervision, transfers and amb with RW with min assist. Pt amb a total of 100 ft, required seated rest break every 20 ft, amb limited due to decreased endurance and easily fatigued. Assisted pt to bathroom, pt able to perform toileting tasks with supervision. Pt appeared to be slightly nauseas and fatigued throughout session but motivated to participate in PT. Discussed DC plans with pt and pt's sister, will practice steps this afternoon.    Follow Up Recommendations  Home health PT;Supervision/Assistance - 24 hour     Equipment Recommendations  Rolling walker with 5" wheels    Recommendations for Other Services       Precautions / Restrictions Precautions Precautions: Fall;Knee Precaution Booklet Issued: Yes (comment) Restrictions Weight Bearing Restrictions: Yes LLE Weight Bearing: Weight bearing as tolerated    Mobility  Bed Mobility Overal bed mobility: Needs Assistance Bed Mobility: Supine to Sit     Supine to sit: Supervision     General bed mobility comments: Pt utilized bed rails, able to scoot to seated EOB with no cues   Transfers Overall transfer level: Needs assistance Equipment used: Rolling walker (2 wheeled) Transfers: Sit to/from Stand Sit to Stand: Min assist         General transfer comment: Cues for proper hand placement on RW and bed, min assist to rise  Ambulation/Gait Ambulation/Gait assistance: Min assist;+2 safety/equipment (chair follow) Ambulation Distance (Feet): 100 Feet Assistive device: Rolling walker (2 wheeled) Gait Pattern/deviations: Step-to pattern     General Gait Details: amb with step-to gait pattern, occasional cues for proper LE sequencing, increased  stance time on LLE, requires rest breaks every 20 feet   Stairs            Wheelchair Mobility    Modified Rankin (Stroke Patients Only)       Balance Overall balance assessment: Needs assistance Sitting-balance support: Feet supported Sitting balance-Leahy Scale: Good Sitting balance - Comments: Able to sit in recliner and on toilet, no assist.    Standing balance support: Bilateral upper extremity supported Standing balance-Leahy Scale: Good Standing balance comment: Pt able to stand with RW with CGA, able to maintain position while performing toileting tasks                            Cognition Arousal/Alertness: Awake/alert Behavior During Therapy: [facility] for tasks assessed/performed Overall Cognitive Status: Within Functional Limits for tasks assessed                                        Exercises Total Joint Exercises Goniometric ROM: L knee AAROM 0-82 degrees Other Exercises Other Exercises: Ther-ex LLE 15x, ankle pumps, SLRs, hip abd/add, SAQs, quad sets, heel slides, cues for proper technique.  Other Exercises: Pt amb to bathroom, able to transfer to toilet with min assist, perform toileting tasks, and rise to standing with RW.     General Comments        Pertinent Vitals/Pain Pain Assessment: Faces Faces Pain Scale: Hurts little more Pain Location: L knee Pain Intervention(s): Limited activity within patient's tolerance;Monitored during  session;Premedicated before session;Repositioned;Ice applied    Home Living                      Prior Function            PT Goals (current goals can now be found in the care plan section) Acute Rehab PT Goals Patient Stated Goal: to go home PT Goal Formulation: With patient/family Time For Goal Achievement: 07/30/17 Potential to Achieve Goals: Good Progress towards PT goals: Progressing toward goals    Frequency    BID      PT Plan Discharge plan needs to be  updated    Co-evaluation              AM-PAC PT "6 Clicks" Daily Activity  Outcome Measure  Difficulty turning over in bed (including adjusting bedclothes, sheets and blankets)?: None Difficulty moving from lying on back to sitting on the side of the bed? : None Difficulty sitting down on and standing up from a chair with arms (e.g., wheelchair, bedside commode, etc,.)?: Unable Help needed moving to and from a bed to chair (including a wheelchair)?: A Little Help needed walking in hospital room?: A Little Help needed climbing 3-5 steps with a railing? : A Lot 6 Click Score: 17    End of Session Equipment Utilized During Treatment: Gait belt Activity Tolerance: Patient tolerated treatment well;Patient limited by fatigue Patient left: in chair;with call bell/phone within reach;with chair alarm set;with family/visitor present Nurse Communication: Mobility status PT Visit Diagnosis: Other abnormalities of gait and mobility (R26.89);Muscle weakness (generalized) (M62.81);Pain Pain - Right/Left: Left Pain - part of body: Knee     Time: 1610-9604 PT Time Calculation (min) (ACUTE ONLY): 40 min  Charges:                       G Codes:  Functional Assessment Tool Used: AM-PAC 6 Clicks Basic Mobility Functional Limitation: Mobility: Walking and moving around Mobility: Walking and Moving Around Current Status (V4098): At least 40 percent but less than 60 percent impaired, limited or restricted Mobility: Walking and Moving Around Goal Status [phone]): At least 20 percent but less than 40 percent impaired, limited or restricted    Renford Dills, SPT   Renford Dills 07/18/2017, 12:28 PM

## 2017-07-18 NOTE — Progress Notes (Signed)
Discharge instructions and medication details reviewed with patient and family. Patient verbalizes understanding. All questions answered.  Lovenox education provided with teach back. Printed prescriptions for Lovenox, tramadol and Vicodin given along with printed AVS. All patient's belongings packed including polar care, bedside commode and walker. 2 honeycomb dressings provided. Patient escorted out via wheelchair.   Stephannie Peters, RN

## 2017-07-18 NOTE — Progress Notes (Signed)
   Subjective: 2 Days Post-Op Procedure(s) (LRB): COMPUTER ASSISTED TOTAL KNEE ARTHROPLASTY (Left) Patient reports pain as 7 on 0-10 scale.   Patient is well, and has had no acute complaints or problems Did well with therapy yesterday. Still feel the patient is able to go home. According to her sister her biggest problem yesterday with therapy was that she was very sleepy and tired from taking the oxycodone. This is also causing her to be nauseated. Plan is to go Home after hospital stay. no nausea and no vomiting Patient denies any chest pains or shortness of breath. Objective: Vital signs in last 24 hours: Temp:  [98.1 F (36.7 C)-99.5 F (37.5 C)] 99.5 F (37.5 C) (10/10 0501) Pulse Rate:  [71-75] 71 (10/09 1912) Resp:  [16] 16 (10/09 1912) BP: (118-130)/(57-67) 130/57 (10/09 1912) SpO2:  [95 %-96 %] 95 % (10/09 1912) well approximated incision Heels are non tender and elevated off the bed using rolled towels Intake/Output from previous day: 10/09 0701 - 10/10 0700 In: 480 [P.O.:480] Out: -  Intake/Output this shift: No intake/output data recorded.   Recent Labs  07/17/17 0547  HGB 12.7    Recent Labs  07/17/17 0547  WBC 14.9*  RBC 4.03  HCT 37.2  PLT 235    Recent Labs  07/17/17 0547  NA 135  K 3.6  CL 97*  CO2 31  BUN 10  CREATININE 0.73  GLUCOSE 143*  CALCIUM 8.8*   No results for input(s): LABPT, INR in the last 72 hours.  EXAM General - Patient is Alert, Appropriate and Oriented Extremity - Neurologically intact Neurovascular intact Sensation intact distally Intact pulses distally Dorsiflexion/Plantar flexion intact No cellulitis present Compartment soft Dressing - dressing C/D/I Motor Function - intact, moving foot and toes well on exam.    Past Medical History:  Diagnosis Date  . Arthritis   . Asthma   . Diverticulosis   . Hypertension     Assessment/Plan: 2 Days Post-Op Procedure(s) (LRB): COMPUTER ASSISTED TOTAL KNEE  ARTHROPLASTY (Left) Active Problems:   S/P total knee arthroplasty  Estimated body mass index is 38.04 kg/m as calculated from the following:   Height as of this encounter:  (1.575 m).   Weight as of this encounter: 94.3 kg (208 lb). Up with therapy Discharge home with home health  Labs: None DVT Prophylaxis - Lovenox, Foot Pumps and TED hose Weight-Bearing as tolerated to left leg Hemovac was discontinued on today's visit Lactulose ordered for bowel movement We'll change her from oxycodone to hydrocodone. Patient should be able to go home this afternoon. We'll need to change dressing on the operative leg prior to being discharged. TED stockings are to be applied to both legs.  Lynnda Shields. [facility] PA [facility] 07/18/2017, 6:49 AM

## 2017-07-18 NOTE — Care Management (Signed)
Patient and family now report that patient needs a walker. Ordered from [facility].

## 2017-07-18 NOTE — Progress Notes (Signed)
PT is recommending home health. Patient is discharging home today. RN case manager aware of above. Please reconsult if future social work needs arise. CSW signing off.   [employer], LCSW [phone]

## 2017-07-18 NOTE — Progress Notes (Signed)
Occupational Therapy Treatment Patient Details Name: Ruth Rojas MRN: [id number] DOB: 05-29-1951 Today's Date: 07/18/2017    History of present illness Pt is s/p L TKA on 10/8.    OT comments  Pt seen for OT treatment this date. Pt/dtr had questions about AE/DME. Educated in use of 3:1 for toileting needs and AE for lower body dressing as well as compression stocking mgt and polar care mgt. Pt/dtr verbalized understanding. Pt/dtr feeling confident in return home with supports in place. Spoke with case mgt to ensure [facility] was ordered.    Follow Up Recommendations  Home health OT    Equipment Recommendations  3 in 1 bedside commode;Other (comment) (reacher, sock aid)    Recommendations for Other Services      Precautions / Restrictions Precautions Precautions: Fall;Knee  Restrictions Weight Bearing Restrictions: Yes LLE Weight Bearing: Weight bearing as tolerated       Mobility Bed Mobility    Transfers    Balance                           ADL either performed or assessed with clinical judgement   ADL Overall ADL's : Needs assistance/impaired               Lower Body Bathing Details (indicate cue type and reason): reviewed falls prevention/safety set up for seated shower. Pt/dtr verbalized understanding.       Lower Body Dressing Details (indicate cue type and reason): reviewed AE for LB dressing. Pt/dtr verbalized understanding.   Toilet Transfer Details (indicate cue type and reason): educated pt/dtr in 3:1 for toileting needs beside bed and over regular commode at home                 Vision       Perception     Praxis      Cognition Arousal/Alertness: Awake/alert Behavior During Therapy: WFL for tasks assessed/performed Overall Cognitive Status: Within Functional Limits for tasks assessed                                          Exercises  Other Exercises Other Exercises: Reviewed education on compression  stocking mgt and polar care mgt with pt/dtr. Both verbalized understanding.   Shoulder Instructions       General Comments      Pertinent Vitals/ Pain       Pain Assessment: No/denies pain Faces Pain Scale: Hurts little more Pain Location: L knee Pain Intervention(s): Limited activity within patient's tolerance;Monitored during session;Premedicated before session;Repositioned;Ice applied  Home Living                                          Prior Functioning/Environment              Frequency  Min 1X/week        Progress Toward Goals  OT Goals(current goals can now be found in the care plan section)  Progress towards OT goals: Progressing toward goals  Acute Rehab OT Goals Patient Stated Goal: to go home OT Goal Formulation: With patient/family Time For Goal Achievement: 07/31/17 Potential to Achieve Goals: Good  Plan Discharge plan needs to be updated;Frequency remains appropriate    Co-evaluation  AM-PAC PT "6 Clicks" Daily Activity     Outcome Measure   Help from another person eating meals?: A Little Help from another person taking care of personal grooming?: A Little Help from another person toileting, which includes using toliet, bedpan, or urinal?: A Little Help from another person bathing (including washing, rinsing, drying)?: A Lot Help from another person to put on and taking off regular upper body clothing?: A Little Help from another person to put on and taking off regular lower body clothing?: A Lot 6 Click Score: 16    End of Session    OT Visit Diagnosis: Other abnormalities of gait and mobility (R26.89)   Activity Tolerance Patient tolerated treatment well   Patient Left in chair;with call bell/phone within reach;with chair alarm set;with family/visitor present;Other (comment) (polar care in place)   Nurse Communication          Time: [phone] OT Time Calculation (min): 11 min  Charges:  OT General Charges $OT Visit: 1 Visit OT Treatments $Self Care/Home Management : 8-22 mins  Richrd Prime, MPH, MS, OTR/L ascom [phone] 07/18/17, 2:31 PM

## 2017-07-18 NOTE — Care Management Note (Signed)
Case Management Note  Patient Details  Name: MAREE AINLEY MRN: [id number] Date of Birth: 1951/01/07  Subjective/Objective:   Discharging today                 Action/Plan: Called Lovenox 40 mg # 14 no refills to [facility]. Patient for home with [facility]. [facility] notified of discharge.   Expected Discharge Date:  07/18/17               Expected Discharge Plan:  Home w Home Health Services  In-House Referral:     Discharge planning Services  CM Consult  Post Acute Care Choice:  Home Health Choice offered to:  Patient  DME Arranged:    DME Agency:     HH Arranged:  PT HH Agency:  [facility]  Status of Service:  Completed, signed off  If discussed at Long Length of Stay Meetings, dates discussed:    Additional Comments:  Marily Memos, RN 07/18/2017, 10:54 AM

## 2017-11-08 ENCOUNTER — Encounter
Admission: RE | Admit: 2017-11-08 | Discharge: 2017-11-08 | Disposition: A | Discharge status: Home / Self Care | Payer: Medicare Other | Source: Ambulatory Visit | Attending: Orthopedic Surgery | Admitting: Orthopedic Surgery

## 2017-11-08 ENCOUNTER — Other Ambulatory Visit: Payer: Self-pay

## 2017-11-08 DIAGNOSIS — I10 Essential (primary) hypertension: Secondary | ICD-10-CM | POA: Diagnosis not present

## 2017-11-08 DIAGNOSIS — Z01812 Encounter for preprocedural laboratory examination: Secondary | ICD-10-CM | POA: Diagnosis not present

## 2017-11-08 LAB — URINALYSIS, ROUTINE W REFLEX MICROSCOPIC
Bilirubin Urine: NEGATIVE
GLUCOSE, UA: NEGATIVE mg/dL
Hgb urine dipstick: NEGATIVE
KETONES UR: NEGATIVE mg/dL
LEUKOCYTES UA: NEGATIVE
Nitrite: NEGATIVE
PROTEIN: NEGATIVE mg/dL
Specific Gravity, Urine: 1.013 (ref 1.005–1.030)
pH: 6 (ref 5.0–8.0)

## 2017-11-08 LAB — CBC
HEMATOCRIT: 46.1 % (ref 35.0–47.0)
HEMOGLOBIN: 14.9 g/dL (ref 12.0–16.0)
MCH: 29.3 pg (ref 26.0–34.0)
MCHC: 32.3 g/dL (ref 32.0–36.0)
MCV: 90.8 fL (ref 80.0–100.0)
Platelets: 240 10*3/uL (ref 150–440)
RBC: 5.07 MIL/uL (ref 3.80–5.20)
RDW: 14.3 % (ref 11.5–14.5)
WBC: 10.3 10*3/uL (ref 3.6–11.0)

## 2017-11-08 LAB — COMPREHENSIVE METABOLIC PANEL
ALK PHOS: 56 U/L (ref 38–126)
ALT: 15 U/L (ref 14–54)
AST: 22 U/L (ref 15–41)
Albumin: 4.7 g/dL (ref 3.5–5.0)
Anion gap: 10 (ref 5–15)
BUN: 17 mg/dL (ref 6–20)
CALCIUM: 9.7 mg/dL (ref 8.9–10.3)
CO2: 28 mmol/L (ref 22–32)
CREATININE: 0.66 mg/dL (ref 0.44–1.00)
Chloride: 101 mmol/L (ref 101–111)
GFR calc non Af Amer: >60 mL/min (ref >60)
Glucose, Bld: 93 mg/dL (ref 65–99)
Potassium: 3.1 mmol/L — ABNORMAL LOW (ref 3.5–5.1)
Sodium: 139 mmol/L (ref 135–145)
Total Bilirubin: 0.7 mg/dL (ref 0.3–1.2)
Total Protein: 7.7 g/dL (ref 6.5–8.1)

## 2017-11-08 LAB — TYPE AND SCREEN
ABO/RH(D): O POS
Antibody Screen: NEGATIVE

## 2017-11-08 LAB — SURGICAL PCR SCREEN
MRSA, PCR: NEGATIVE
Staphylococcus aureus: NEGATIVE

## 2017-11-08 LAB — C-REACTIVE PROTEIN: CRP: 1 mg/dL — ABNORMAL HIGH (ref <1.0)

## 2017-11-08 LAB — PROTIME-INR
INR: 0.84
Prothrombin Time: 11.4 seconds (ref 11.4–15.2)

## 2017-11-08 LAB — SEDIMENTATION RATE: Sed Rate: 6 mm/hr (ref 0–30)

## 2017-11-08 LAB — APTT: aPTT: 37 seconds — ABNORMAL HIGH (ref 24–36)

## 2017-11-08 NOTE — Patient Instructions (Signed)
Your procedure is scheduled on: Wednesday, November 21, 2017 Report to Same Day Surgery on the 2nd floor in the [facility]. To find out your arrival time, please call [phone] between 1PM - 3PM on: Tuesday, November 20, 2017  REMEMBER: Instructions that are not followed completely may result in serious medical risk, up to and including death; or upon the discretion of your surgeon and anesthesiologist your surgery may need to be rescheduled.  Do not eat food after midnight the night before your procedure.  No gum chewing or hard candies.  You may however, drink CLEAR liquids up to 2 hours before you are scheduled to arrive at the hospital for your procedure.  Do not drink clear liquids within 2 hours of the start of your surgery.  Clear liquids include: - water  - apple juice without pulp - clear gatorade - black coffee or tea (Do NOT add anything to the coffee or tea) Do NOT drink anything that is not on this list.  No Alcohol for 24 hours before or after surgery.  No Smoking including e-cigarettes for 24 hours prior to surgery. No chewable tobacco products for at least 6 hours prior to surgery. No nicotine patches on the day of surgery.  On the morning of surgery brush your teeth with toothpaste and water, you may rinse your mouth with mouthwash if you wish. Do not swallow any  toothpaste of mouthwash.  Notify your doctor if there is any change in your medical condition (cold, fever, infection).  Do not wear jewelry, make-up, hairpins, clips or nail polish.  Do not wear lotions, powders, or perfumes. You may wear deodorant.  Do not shave 48 hours prior to surgery.   Contacts and dentures may not be worn into surgery.  Do not bring valuables to the hospital. [facility] belongings or valuables.  TAKE THESE MEDICATIONS THE MORNING OF SURGERY WITH A SIP OF WATER:  1.  Albuterol inhaler 2.  ellipta inhaler 3.  Tylenol (as needed for  pain)  Use an antibacterial soap as directed on instruction sheet.  Use inhalers on the day of surgery and bring to the hospital.  On February 5:  Stop aspirin and Anti-inflammatories such as Advil, Aleve, Ibuprofen, Motrin, Naproxen, Naprosyn, Goodie powder, or aspirin products. (May take Tylenol or Acetaminophen if needed.)  On February 5:  Stop ANY OVER THE COUNTER supplements until after surgery. (May continue multivitamin.)  If you are being admitted to the hospital overnight, leave your suitcase in the car. After surgery it may be brought to your room.  Please call the number above if you have any questions about these instructions.

## 2017-11-09 LAB — URINE CULTURE: Special Requests: NORMAL

## 2017-11-12 NOTE — Pre-Procedure Instructions (Addendum)
UC FAXED TO DR Ernest PineHOOTEN OFFICE NO RECOLLECTION NEEDED BY E RUSSELL RN, DR Ernest PineHOOTEN

## 2017-11-14 ENCOUNTER — Encounter
Admission: RE | Admit: 2017-11-14 | Discharge: 2017-11-14 | Disposition: A | Discharge status: Home / Self Care | Payer: Medicare Other | Source: Ambulatory Visit | Attending: Orthopedic Surgery | Admitting: Orthopedic Surgery

## 2017-11-14 DIAGNOSIS — Z01818 Encounter for other preprocedural examination: Secondary | ICD-10-CM | POA: Diagnosis present

## 2017-11-14 LAB — POTASSIUM: Potassium: 4.1 mmol/L (ref 3.5–5.1)

## 2017-11-18 ENCOUNTER — Encounter: Payer: Self-pay | Admitting: Orthopedic Surgery

## 2017-11-18 DIAGNOSIS — M1710 Unilateral primary osteoarthritis, unspecified knee: Secondary | ICD-10-CM | POA: Insufficient documentation

## 2017-11-18 DIAGNOSIS — M179 Osteoarthritis of knee, unspecified: Secondary | ICD-10-CM | POA: Insufficient documentation

## 2017-11-18 NOTE — Discharge Instructions (Signed)
°  Instructions after Total Knee Replacement ° ° Simrah Chatham P. Cimone Fahey, Jr., M.D.    ° Dept. of Orthopaedics & Sports Medicine ° [facility] ° [address] ° [place], [place]  [address] ° Phone: [phone]   Fax: [phone] ° °  °DIET: °• Drink plenty of non-alcoholic fluids. °• Resume your normal diet. Include foods high in fiber. ° °ACTIVITY:  °• You may use crutches or a walker with weight-bearing as tolerated, unless instructed otherwise. °• You may be weaned off of the walker or crutches by your Physical Therapist.  °• Do NOT place pillows under the knee. Anything placed under the knee could limit your ability to straighten the knee.   °• Continue doing gentle exercises. Exercising will reduce the pain and swelling, increase motion, and prevent muscle weakness.   °• Please continue to use the TED compression stockings for 6 weeks. You may remove the stockings at night, but should reapply them in the morning. °• Do not drive or operate any equipment until instructed. ° °WOUND CARE:  °• Continue to use the PolarCare or ice packs periodically to reduce pain and swelling. °• You may bathe or shower after the staples are removed at the first office visit following surgery. ° °MEDICATIONS: °• You may resume your regular medications. °• Please take the pain medication as prescribed on the medication. °• Do not take pain medication on an empty stomach. °• You have been given a prescription for a blood thinner (Lovenox or Coumadin). Please take the medication as instructed. (NOTE: After completing a 2 week course of Lovenox, take one Enteric-coated aspirin once a day. This along with elevation will help reduce the possibility of phlebitis in your operated leg.) °• Do not drive or drink alcoholic beverages when taking pain medications. ° °CALL THE OFFICE FOR: °• Temperature above 101 degrees °• Excessive bleeding or drainage on the dressing. °• Excessive swelling, coldness, or paleness of the toes. °• Persistent  nausea and vomiting. ° °FOLLOW-UP:  °• You should have an appointment to return to the office in 10-14 days after surgery. °• Arrangements have been made for continuation of Physical Therapy (either home therapy or outpatient therapy). °  °

## 2017-11-20 MED ORDER — SODIUM CHLORIDE 0.9 % IV SOLN
1000.0000 mg | INTRAVENOUS | Status: AC
  Administered 2017-11-21: 1000 mg via INTRAVENOUS
  Filled 2017-11-20: qty 10

## 2017-11-20 MED ORDER — CEFAZOLIN SODIUM-DEXTROSE 2-4 GM/100ML-% IV SOLN
2.0000 g | INTRAVENOUS | Status: AC
  Administered 2017-11-21: 2 g via INTRAVENOUS

## 2017-11-21 ENCOUNTER — Other Ambulatory Visit: Payer: Self-pay

## 2017-11-21 ENCOUNTER — Ambulatory Visit: Payer: Medicare Other | Admitting: Anesthesiology

## 2017-11-21 ENCOUNTER — Encounter
Admission: RE | Disposition: A | Discharge status: Home Health | Payer: Self-pay | Source: Ambulatory Visit | Attending: Orthopedic Surgery

## 2017-11-21 ENCOUNTER — Inpatient Hospital Stay
Admission: RE | Admit: 2017-11-21 | Discharge: 2017-11-23 | DRG: 470 | Disposition: A | Discharge status: Home Health | Payer: Medicare Other | Source: Ambulatory Visit | Attending: Orthopedic Surgery | Admitting: Orthopedic Surgery

## 2017-11-21 ENCOUNTER — Inpatient Hospital Stay: Payer: Medicare Other

## 2017-11-21 DIAGNOSIS — Z881 Allergy status to other antibiotic agents status: Secondary | ICD-10-CM | POA: Diagnosis not present

## 2017-11-21 DIAGNOSIS — Z888 Allergy status to other drugs, medicaments and biological substances status: Secondary | ICD-10-CM | POA: Diagnosis not present

## 2017-11-21 DIAGNOSIS — Z96652 Presence of left artificial knee joint: Secondary | ICD-10-CM | POA: Diagnosis present

## 2017-11-21 DIAGNOSIS — M1711 Unilateral primary osteoarthritis, right knee: Principal | ICD-10-CM | POA: Diagnosis present

## 2017-11-21 DIAGNOSIS — Z882 Allergy status to sulfonamides status: Secondary | ICD-10-CM

## 2017-11-21 DIAGNOSIS — Z7951 Long term (current) use of inhaled steroids: Secondary | ICD-10-CM | POA: Diagnosis not present

## 2017-11-21 DIAGNOSIS — I10 Essential (primary) hypertension: Secondary | ICD-10-CM | POA: Diagnosis present

## 2017-11-21 DIAGNOSIS — Z7982 Long term (current) use of aspirin: Secondary | ICD-10-CM | POA: Diagnosis not present

## 2017-11-21 DIAGNOSIS — J45909 Unspecified asthma, uncomplicated: Secondary | ICD-10-CM | POA: Diagnosis present

## 2017-11-21 DIAGNOSIS — Z883 Allergy status to other anti-infective agents status: Secondary | ICD-10-CM

## 2017-11-21 DIAGNOSIS — Z885 Allergy status to narcotic agent status: Secondary | ICD-10-CM | POA: Diagnosis not present

## 2017-11-21 DIAGNOSIS — Z79899 Other long term (current) drug therapy: Secondary | ICD-10-CM

## 2017-11-21 DIAGNOSIS — Z87891 Personal history of nicotine dependence: Secondary | ICD-10-CM

## 2017-11-21 DIAGNOSIS — Z96659 Presence of unspecified artificial knee joint: Secondary | ICD-10-CM

## 2017-11-21 HISTORY — PX: KNEE ARTHROPLASTY: SHX992

## 2017-11-21 SURGERY — ARTHROPLASTY, KNEE, TOTAL, USING IMAGELESS COMPUTER-ASSISTED NAVIGATION
Anesthesia: Spinal | Laterality: Right | Wound class: Clean

## 2017-11-21 MED ORDER — POTASSIUM GLUCONATE 595 (99 K) MG PO TABS
595.0000 mg | ORAL_TABLET | Freq: Every day | ORAL | Status: DC
  Administered 2017-11-22 – 2017-11-23 (×2): 595 mg via ORAL
  Filled 2017-11-21: qty 1

## 2017-11-21 MED ORDER — BUPIVACAINE HCL (PF) 0.25 % IJ SOLN
INTRAMUSCULAR | Status: DC | PRN
  Administered 2017-11-21: 60 mL

## 2017-11-21 MED ORDER — MIDAZOLAM HCL 2 MG/2ML IJ SOLN
INTRAMUSCULAR | Status: AC
  Filled 2017-11-21: qty 2

## 2017-11-21 MED ORDER — VITAMIN E 180 MG (400 UNIT) PO CAPS
400.0000 [IU] | ORAL_CAPSULE | Freq: Every day | ORAL | Status: DC
  Administered 2017-11-21 – 2017-11-23 (×3): 400 [IU] via ORAL
  Filled 2017-11-21 (×3): qty 1

## 2017-11-21 MED ORDER — FAMOTIDINE 20 MG PO TABS
ORAL_TABLET | ORAL | Status: AC
  Administered 2017-11-21: 20 mg via ORAL
  Filled 2017-11-21: qty 1

## 2017-11-21 MED ORDER — ONDANSETRON HCL 4 MG PO TABS: 4.0000 mg | ORAL_TABLET | Freq: Four times a day (QID) | ORAL | Status: DC | PRN

## 2017-11-21 MED ORDER — PROPOFOL 500 MG/50ML IV EMUL
INTRAVENOUS | Status: AC
Start: 2017-11-21 — End: 2017-11-21
  Filled 2017-11-21: qty 50

## 2017-11-21 MED ORDER — TAPENTADOL HCL 50 MG PO TABS: 50.0000 mg | ORAL_TABLET | ORAL | Status: DC | PRN

## 2017-11-21 MED ORDER — NEOMYCIN-POLYMYXIN B GU 40-200000 IR SOLN
Status: AC
  Filled 2017-11-21: qty 20

## 2017-11-21 MED ORDER — GABAPENTIN 300 MG PO CAPS
300.0000 mg | ORAL_CAPSULE | Freq: Three times a day (TID) | ORAL | Status: DC
  Administered 2017-11-21 – 2017-11-23 (×5): 300 mg via ORAL
  Filled 2017-11-21 (×6): qty 1

## 2017-11-21 MED ORDER — GLYCOPYRROLATE 0.2 MG/ML IJ SOLN
INTRAMUSCULAR | Status: AC
  Filled 2017-11-21: qty 1

## 2017-11-21 MED ORDER — SODIUM CHLORIDE 0.9 % IJ SOLN
INTRAMUSCULAR | Status: AC
  Filled 2017-11-21: qty 50

## 2017-11-21 MED ORDER — MONTELUKAST SODIUM 10 MG PO TABS
10.0000 mg | ORAL_TABLET | Freq: Every day | ORAL | Status: DC
  Administered 2017-11-21 – 2017-11-22 (×2): 10 mg via ORAL
  Filled 2017-11-21 (×2): qty 1

## 2017-11-21 MED ORDER — ACETAMINOPHEN 325 MG PO TABS: 650.0000 mg | ORAL_TABLET | ORAL | Status: DC | PRN

## 2017-11-21 MED ORDER — PROPOFOL 500 MG/50ML IV EMUL
INTRAVENOUS | Status: DC | PRN
  Administered 2017-11-21: 70 ug/kg/min via INTRAVENOUS

## 2017-11-21 MED ORDER — MAGNESIUM HYDROXIDE 400 MG/5ML PO SUSP
30.0000 mL | Freq: Every day | ORAL | Status: DC | PRN
  Administered 2017-11-22: 30 mL via ORAL
  Filled 2017-11-21: qty 30

## 2017-11-21 MED ORDER — ADULT MULTIVITAMIN W/MINERALS CH
1.0000 | ORAL_TABLET | Freq: Every day | ORAL | Status: DC
  Administered 2017-11-21 – 2017-11-22 (×2): 1 via ORAL
  Filled 2017-11-21 (×2): qty 1

## 2017-11-21 MED ORDER — CHLORHEXIDINE GLUCONATE 4 % EX LIQD: 60.0000 mL | Freq: Once | CUTANEOUS | Status: DC

## 2017-11-21 MED ORDER — BUPIVACAINE LIPOSOME 1.3 % IJ SUSP
INTRAMUSCULAR | Status: AC
  Filled 2017-11-21: qty 20

## 2017-11-21 MED ORDER — ORAL CARE MOUTH RINSE
15.0000 mL | Freq: Two times a day (BID) | OROMUCOSAL | Status: DC
  Administered 2017-11-22: 15 mL via OROMUCOSAL

## 2017-11-21 MED ORDER — PANTOPRAZOLE SODIUM 40 MG PO TBEC
40.0000 mg | DELAYED_RELEASE_TABLET | Freq: Two times a day (BID) | ORAL | Status: DC
  Administered 2017-11-21 – 2017-11-23 (×4): 40 mg via ORAL
  Filled 2017-11-21 (×4): qty 1

## 2017-11-21 MED ORDER — SODIUM CHLORIDE 0.9 % IV SOLN
INTRAVENOUS | Status: DC | PRN
  Administered 2017-11-21: 30 ug/min via INTRAVENOUS

## 2017-11-21 MED ORDER — TAPENTADOL HCL 50 MG PO TABS
100.0000 mg | ORAL_TABLET | ORAL | Status: DC | PRN
  Administered 2017-11-21 – 2017-11-23 (×7): 100 mg via ORAL
  Filled 2017-11-21 (×7): qty 2

## 2017-11-21 MED ORDER — VITAMIN C 500 MG PO TABS
1500.0000 mg | ORAL_TABLET | Freq: Every day | ORAL | Status: DC
  Administered 2017-11-21 – 2017-11-23 (×3): 1500 mg via ORAL
  Filled 2017-11-21 (×3): qty 3

## 2017-11-21 MED ORDER — SODIUM CHLORIDE 0.9 % IV SOLN
INTRAVENOUS | Status: DC
  Administered 2017-11-21 – 2017-11-22 (×3): via INTRAVENOUS

## 2017-11-21 MED ORDER — CELECOXIB 200 MG PO CAPS
ORAL_CAPSULE | ORAL | Status: AC
  Administered 2017-11-21: 400 mg
  Filled 2017-11-21: qty 2

## 2017-11-21 MED ORDER — MIDAZOLAM HCL 5 MG/5ML IJ SOLN
INTRAMUSCULAR | Status: DC | PRN
  Administered 2017-11-21: 2 mg via INTRAVENOUS

## 2017-11-21 MED ORDER — GABAPENTIN 300 MG PO CAPS
ORAL_CAPSULE | ORAL | Status: AC
  Administered 2017-11-21: 300 mg via ORAL
  Filled 2017-11-21: qty 1

## 2017-11-21 MED ORDER — METOCLOPRAMIDE HCL 10 MG PO TABS
10.0000 mg | ORAL_TABLET | Freq: Three times a day (TID) | ORAL | Status: DC
  Administered 2017-11-21 – 2017-11-23 (×7): 10 mg via ORAL
  Filled 2017-11-21 (×7): qty 1

## 2017-11-21 MED ORDER — ENOXAPARIN SODIUM 30 MG/0.3ML ~~LOC~~ SOLN
30.0000 mg | Freq: Two times a day (BID) | SUBCUTANEOUS | Status: DC
  Administered 2017-11-22 – 2017-11-23 (×3): 30 mg via SUBCUTANEOUS
  Filled 2017-11-21 (×3): qty 0.3

## 2017-11-21 MED ORDER — GABAPENTIN 300 MG PO CAPS
300.0000 mg | ORAL_CAPSULE | Freq: Once | ORAL | Status: AC
  Administered 2017-11-21: 300 mg via ORAL

## 2017-11-21 MED ORDER — MORPHINE SULFATE (PF) 2 MG/ML IV SOLN: 2.0000 mg | INTRAVENOUS | Status: DC | PRN

## 2017-11-21 MED ORDER — CELECOXIB 200 MG PO CAPS
400.0000 mg | ORAL_CAPSULE | Freq: Once | ORAL | Status: AC
  Administered 2017-11-21: 400 mg via ORAL
  Filled 2017-11-21: qty 2

## 2017-11-21 MED ORDER — PROPOFOL 10 MG/ML IV BOLUS
INTRAVENOUS | Status: DC | PRN
  Administered 2017-11-21 (×3): 18 mg via INTRAVENOUS

## 2017-11-21 MED ORDER — BENEFIBER ON THE GO PO POWD
1.0000 | Freq: Two times a day (BID) | ORAL | Status: DC
  Administered 2017-11-22 – 2017-11-23 (×2): 1 via ORAL
  Filled 2017-11-21: qty 475

## 2017-11-21 MED ORDER — BUPIVACAINE HCL (PF) 0.25 % IJ SOLN
INTRAMUSCULAR | Status: AC
  Filled 2017-11-21: qty 60

## 2017-11-21 MED ORDER — NEOMYCIN-POLYMYXIN B GU 40-200000 IR SOLN
Status: DC | PRN
  Administered 2017-11-21: 14 mL

## 2017-11-21 MED ORDER — LIDOCAINE HCL (CARDIAC) 20 MG/ML IV SOLN
INTRAVENOUS | Status: DC | PRN
  Administered 2017-11-21: 100 mg via INTRAVENOUS

## 2017-11-21 MED ORDER — DEXAMETHASONE SODIUM PHOSPHATE 10 MG/ML IJ SOLN
INTRAMUSCULAR | Status: AC
  Administered 2017-11-21: 8 mg via INTRAVENOUS
  Filled 2017-11-21: qty 1

## 2017-11-21 MED ORDER — BISACODYL 10 MG RE SUPP
10.0000 mg | Freq: Every day | RECTAL | Status: DC | PRN
  Administered 2017-11-23: 10 mg via RECTAL
  Filled 2017-11-21: qty 1

## 2017-11-21 MED ORDER — BUPIVACAINE HCL (PF) 0.5 % IJ SOLN
INTRAMUSCULAR | Status: DC | PRN
  Administered 2017-11-21: 3 mL

## 2017-11-21 MED ORDER — ALUM & MAG HYDROXIDE-SIMETH 200-200-20 MG/5ML PO SUSP: 30.0000 mL | ORAL | Status: DC | PRN

## 2017-11-21 MED ORDER — SODIUM CHLORIDE 0.9 % IV SOLN
INTRAVENOUS | Status: DC | PRN
  Administered 2017-11-21: 60 mL

## 2017-11-21 MED ORDER — ACETAMINOPHEN 10 MG/ML IV SOLN
1000.0000 mg | Freq: Four times a day (QID) | INTRAVENOUS | Status: AC
  Administered 2017-11-21 – 2017-11-22 (×4): 1000 mg via INTRAVENOUS
  Filled 2017-11-21 (×4): qty 100

## 2017-11-21 MED ORDER — LIDOCAINE HCL (PF) 2 % IJ SOLN
INTRAMUSCULAR | Status: AC
  Filled 2017-11-21: qty 10

## 2017-11-21 MED ORDER — FAMOTIDINE 20 MG PO TABS
20.0000 mg | ORAL_TABLET | Freq: Once | ORAL | Status: AC
  Administered 2017-11-21: 20 mg via ORAL

## 2017-11-21 MED ORDER — SENNOSIDES-DOCUSATE SODIUM 8.6-50 MG PO TABS
1.0000 | ORAL_TABLET | Freq: Two times a day (BID) | ORAL | Status: DC
  Administered 2017-11-21 – 2017-11-23 (×4): 1 via ORAL
  Filled 2017-11-21 (×4): qty 1

## 2017-11-21 MED ORDER — PROPOFOL 500 MG/50ML IV EMUL
INTRAVENOUS | Status: AC
  Filled 2017-11-21: qty 50

## 2017-11-21 MED ORDER — FLEET ENEMA 7-19 GM/118ML RE ENEM: 1.0000 | ENEMA | Freq: Once | RECTAL | Status: DC | PRN

## 2017-11-21 MED ORDER — GLYCOPYRROLATE 0.2 MG/ML IJ SOLN
INTRAMUSCULAR | Status: DC | PRN
  Administered 2017-11-21: 0.2 mg via INTRAVENOUS

## 2017-11-21 MED ORDER — CELECOXIB 200 MG PO CAPS
200.0000 mg | ORAL_CAPSULE | Freq: Two times a day (BID) | ORAL | Status: DC
  Administered 2017-11-22 – 2017-11-23 (×3): 200 mg via ORAL
  Filled 2017-11-21 (×3): qty 1

## 2017-11-21 MED ORDER — CEFAZOLIN SODIUM-DEXTROSE 2-4 GM/100ML-% IV SOLN
INTRAVENOUS | Status: AC
  Filled 2017-11-21: qty 100

## 2017-11-21 MED ORDER — FENTANYL CITRATE (PF) 100 MCG/2ML IJ SOLN
INTRAMUSCULAR | Status: AC
  Filled 2017-11-21: qty 2

## 2017-11-21 MED ORDER — LACTATED RINGERS IV SOLN
INTRAVENOUS | Status: DC
  Administered 2017-11-21 (×2): via INTRAVENOUS

## 2017-11-21 MED ORDER — ALBUTEROL SULFATE (2.5 MG/3ML) 0.083% IN NEBU: 2.5000 mg | INHALATION_SOLUTION | Freq: Four times a day (QID) | RESPIRATORY_TRACT | Status: DC | PRN

## 2017-11-21 MED ORDER — ONDANSETRON HCL 4 MG/2ML IJ SOLN: 4.0000 mg | Freq: Once | INTRAMUSCULAR | Status: DC | PRN

## 2017-11-21 MED ORDER — CEFAZOLIN SODIUM-DEXTROSE 2-4 GM/100ML-% IV SOLN
2.0000 g | Freq: Four times a day (QID) | INTRAVENOUS | Status: AC
  Administered 2017-11-21 – 2017-11-22 (×4): 2 g via INTRAVENOUS
  Filled 2017-11-21 (×4): qty 100

## 2017-11-21 MED ORDER — HYDROCHLOROTHIAZIDE 12.5 MG PO CAPS
12.5000 mg | ORAL_CAPSULE | Freq: Every day | ORAL | Status: DC
  Administered 2017-11-22: 12.5 mg via ORAL
  Filled 2017-11-21 (×2): qty 1

## 2017-11-21 MED ORDER — TRANEXAMIC ACID 1000 MG/10ML IV SOLN
1000.0000 mg | Freq: Once | INTRAVENOUS | Status: AC
  Administered 2017-11-21: 1000 mg via INTRAVENOUS
  Filled 2017-11-21: qty 10

## 2017-11-21 MED ORDER — LORATADINE 10 MG PO TABS: 10.0000 mg | ORAL_TABLET | Freq: Every day | ORAL | Status: DC | PRN

## 2017-11-21 MED ORDER — FENTANYL CITRATE (PF) 100 MCG/2ML IJ SOLN: 25.0000 ug | INTRAMUSCULAR | Status: DC | PRN

## 2017-11-21 MED ORDER — DEXAMETHASONE SODIUM PHOSPHATE 10 MG/ML IJ SOLN
8.0000 mg | Freq: Once | INTRAMUSCULAR | Status: AC
  Administered 2017-11-21: 8 mg via INTRAVENOUS

## 2017-11-21 MED ORDER — ONDANSETRON HCL 4 MG/2ML IJ SOLN
4.0000 mg | Freq: Four times a day (QID) | INTRAMUSCULAR | Status: DC | PRN
Start: 2017-11-21 — End: 2017-11-23

## 2017-11-21 MED ORDER — BUPIVACAINE HCL (PF) 0.5 % IJ SOLN
INTRAMUSCULAR | Status: AC
  Filled 2017-11-21: qty 10

## 2017-11-21 MED ORDER — FENTANYL CITRATE (PF) 100 MCG/2ML IJ SOLN
INTRAMUSCULAR | Status: DC | PRN
  Administered 2017-11-21: 100 ug via INTRAVENOUS

## 2017-11-21 MED ORDER — MENTHOL 3 MG MT LOZG
1.0000 | LOZENGE | OROMUCOSAL | Status: DC | PRN
  Filled 2017-11-21: qty 9

## 2017-11-21 MED ORDER — BUDESONIDE 0.5 MG/2ML IN SUSP: 0.5000 mg | Freq: Two times a day (BID) | RESPIRATORY_TRACT | Status: DC | PRN

## 2017-11-21 MED ORDER — DIPHENHYDRAMINE HCL 12.5 MG/5ML PO ELIX: 12.5000 mg | ORAL_SOLUTION | ORAL | Status: DC | PRN

## 2017-11-21 MED ORDER — FERROUS SULFATE 325 (65 FE) MG PO TABS
325.0000 mg | ORAL_TABLET | Freq: Two times a day (BID) | ORAL | Status: DC
  Administered 2017-11-21 – 2017-11-23 (×4): 325 mg via ORAL
  Filled 2017-11-21 (×4): qty 1

## 2017-11-21 MED ORDER — PHENOL 1.4 % MT LIQD
1.0000 | OROMUCOSAL | Status: DC | PRN
  Filled 2017-11-21: qty 177

## 2017-11-21 MED ORDER — ACETAMINOPHEN 650 MG RE SUPP: 650.0000 mg | RECTAL | Status: DC | PRN

## 2017-11-21 SURGICAL SUPPLY — 64 items
BATTERY INSTRU NAVIGATION (MISCELLANEOUS) ×12 IMPLANT
BLADE SAW 1 (BLADE) ×3 IMPLANT
BLADE SAW 1/2 (BLADE) ×3 IMPLANT
BLADE SAW 70X12.5 (BLADE) IMPLANT
CANISTER SUCT 1200ML W/VALVE (MISCELLANEOUS) ×3 IMPLANT
CANISTER SUCT 3000ML PPV (MISCELLANEOUS) ×6 IMPLANT
CAPT KNEE TOTAL 3 ATTUNE ×3 IMPLANT
CEMENT HV SMART SET (Cement) ×6 IMPLANT
COOLER POLAR GLACIER W/PUMP (MISCELLANEOUS) ×3 IMPLANT
CUFF TOURN 24 STER (MISCELLANEOUS) IMPLANT
CUFF TOURN 30 STER DUAL PORT (MISCELLANEOUS) IMPLANT
DRAPE SHEET LG 3/4 BI-LAMINATE (DRAPES) ×3 IMPLANT
DRSG DERMACEA 8X12 NADH (GAUZE/BANDAGES/DRESSINGS) ×3 IMPLANT
DRSG OPSITE POSTOP 4X14 (GAUZE/BANDAGES/DRESSINGS) ×3 IMPLANT
DRSG TEGADERM 4X4.75 (GAUZE/BANDAGES/DRESSINGS) ×3 IMPLANT
DURAPREP 26ML APPLICATOR (WOUND CARE) ×6 IMPLANT
ELECT CAUTERY BLADE 6.4 (BLADE) ×3 IMPLANT
ELECT REM PT RETURN 9FT ADLT (ELECTROSURGICAL) ×3
ELECTRODE REM PT RTRN 9FT ADLT (ELECTROSURGICAL) ×1 IMPLANT
EVACUATOR 1/8 PVC DRAIN (DRAIN) ×3 IMPLANT
EX-PIN ORTHOLOCK NAV 4X150 (PIN) ×6 IMPLANT
GLOVE BIOGEL M STRL SZ7.5 (GLOVE) ×6 IMPLANT
GLOVE BIOGEL PI IND STRL 9 (GLOVE) ×1 IMPLANT
GLOVE BIOGEL PI INDICATOR 9 (GLOVE) ×2
GLOVE INDICATOR 8.0 STRL GRN (GLOVE) ×3 IMPLANT
GLOVE SURG SYN 9.0  PF PI (GLOVE) ×2
GLOVE SURG SYN 9.0 PF PI (GLOVE) ×1 IMPLANT
GOWN STRL REUS W/ TWL LRG LVL3 (GOWN DISPOSABLE) ×2 IMPLANT
GOWN STRL REUS W/TWL 2XL LVL3 (GOWN DISPOSABLE) ×3 IMPLANT
GOWN STRL REUS W/TWL LRG LVL3 (GOWN DISPOSABLE) ×4
HOLDER FOLEY CATH W/STRAP (MISCELLANEOUS) ×3 IMPLANT
HOOD PEEL AWAY FLYTE STAYCOOL (MISCELLANEOUS) ×6 IMPLANT
KIT TURNOVER KIT A (KITS) ×3 IMPLANT
KNIFE SCULPS 14X20 (INSTRUMENTS) ×3 IMPLANT
LABEL OR SOLS (LABEL) ×3 IMPLANT
NDL SAFETY ECLIPSE 18X1.5 (NEEDLE) ×1 IMPLANT
NEEDLE HYPO 18GX1.5 SHARP (NEEDLE) ×2
NEEDLE SPNL 20GX3.5 QUINCKE YW (NEEDLE) ×6 IMPLANT
NS IRRIG 500ML POUR BTL (IV SOLUTION) ×3 IMPLANT
PACK TOTAL KNEE (MISCELLANEOUS) ×3 IMPLANT
PAD WRAPON POLAR KNEE (MISCELLANEOUS) ×1 IMPLANT
PIN DRILL QUICK PACK ×6 IMPLANT
PIN FIXATION 1/8DIA X 3INL (PIN) ×3 IMPLANT
PULSAVAC PLUS IRRIG FAN TIP (DISPOSABLE) ×3
SLEEVE STOCKINETTE LIMB 4X8 (MISCELLANEOUS) ×3 IMPLANT
SOL .9 NS 3000ML IRR  AL (IV SOLUTION) ×2
SOL .9 NS 3000ML IRR UROMATIC (IV SOLUTION) ×1 IMPLANT
SOL PREP PVP 2OZ (MISCELLANEOUS) ×3
SOLUTION PREP PVP 2OZ (MISCELLANEOUS) ×1 IMPLANT
SPONGE DRAIN TRACH 4X4 STRL 2S (GAUZE/BANDAGES/DRESSINGS) ×3 IMPLANT
STAPLER SKIN PROX 35W (STAPLE) ×3 IMPLANT
STRAP TIBIA SHORT (MISCELLANEOUS) ×3 IMPLANT
SUCTION FRAZIER HANDLE 10FR (MISCELLANEOUS) ×2
SUCTION TUBE FRAZIER 10FR DISP (MISCELLANEOUS) ×1 IMPLANT
SUT VIC AB 0 CT1 36 (SUTURE) ×3 IMPLANT
SUT VIC AB 1 CT1 36 (SUTURE) ×6 IMPLANT
SUT VIC AB 2-0 CT2 27 (SUTURE) ×3 IMPLANT
SYR 20CC LL (SYRINGE) ×3 IMPLANT
SYR 30ML LL (SYRINGE) ×6 IMPLANT
TIP FAN IRRIG PULSAVAC PLUS (DISPOSABLE) ×1 IMPLANT
TOWEL OR 17X26 4PK STRL BLUE (TOWEL DISPOSABLE) ×3 IMPLANT
TOWER CARTRIDGE SMART MIX (DISPOSABLE) ×3 IMPLANT
TRAY FOLEY W/METER SILVER 16FR (SET/KITS/TRAYS/PACK) ×3 IMPLANT
WRAPON POLAR PAD KNEE (MISCELLANEOUS) ×3

## 2017-11-21 NOTE — Anesthesia Preprocedure Evaluation (Signed)
Anesthesia Evaluation  Patient identified by MRN, date of birth, ID band Patient awake    Reviewed: Allergy & Precautions, NPO status , Patient's Chart, lab work & pertinent test results  History of Anesthesia Complications Negative for: history of anesthetic complications  Airway Mallampati: II       Dental   Pulmonary asthma , neg sleep apnea, neg COPD, former smoker,           Cardiovascular hypertension, (-) Past MI and (-) CHF (-) dysrhythmias (-) Valvular Problems/Murmurs     Neuro/Psych neg Seizures    GI/Hepatic Neg liver ROS, neg GERD  ,  Endo/Other  neg diabetes  Renal/GU negative Renal ROS     Musculoskeletal   Abdominal   Peds  Hematology   Anesthesia Other Findings   Reproductive/Obstetrics                             Anesthesia Physical Anesthesia Plan  ASA: II  Anesthesia Plan: Spinal   Post-op Pain Management:    Induction: Intravenous  PONV Risk Score and Plan: 3  Airway Management Planned: Nasal Cannula  Additional Equipment:   Intra-op Plan:   Post-operative Plan:   Informed Consent: I have reviewed the patients History and Physical, chart, labs and discussed the procedure including the risks, benefits and alternatives for the proposed anesthesia with the patient or authorized representative who has indicated his/her understanding and acceptance.     Plan Discussed with:   Anesthesia Plan Comments:         Anesthesia Quick Evaluation

## 2017-11-21 NOTE — Anesthesia Procedure Notes (Signed)
Spinal  Patient location during procedure: OR Start time: 11/21/2017 11:52 AM End time: 11/21/2017 11:57 AM Staffing Performed: resident/CRNA  Preanesthetic Checklist Completed: patient identified, site marked, surgical consent, pre-op evaluation, timeout performed, IV checked, risks and benefits discussed and monitors and equipment checked Spinal Block Patient position: sitting Prep: Betadine Patient monitoring: heart rate, continuous pulse ox, blood pressure and cardiac monitor Approach: midline Location: L3-4 Injection technique: single-shot Needle Needle type: Introducer and Pencan  Needle gauge: 24 G Needle length: 9 cm Additional Notes Negative paresthesia. Negative blood return. Positive free-flowing CSF. Expiration date of kit checked and confirmed. Patient tolerated procedure well, without complications.

## 2017-11-21 NOTE — NC FL2 (Signed)
  [place] MEDICAID FL2 LEVEL OF CARE SCREENING TOOL     IDENTIFICATION  Patient Name: Ruth Rojas Birthdate: Mar 04, 1951 Sex: female Admission Date (Current Location): 11/21/2017  [place]   Facility and Address:  [facility], [address], [place], [place] [address]      Provider Number: [id number]  Attending Physician Name and Address:  Donato HeinzHooten, James P, MD  Relative Name and Phone Number:       Current Level of Care: Hospital Recommended Level of Care: Skilled Nursing Facility Prior Approval Number:    Date Approved/Denied:   PASRR Number: ([id number])  Discharge Plan: SNF    Current Diagnoses: Patient Active Problem List   Diagnosis Date Noted  . S/P total knee arthroplasty 11/21/2017  . Osteoarthritis of knee 11/18/2017  . Status post total left knee replacement 07/16/2017  . Hyperlipidemia, unspecified 03/15/2015  . Hypertension, essential 03/15/2015  . Impaired fasting blood sugar 03/15/2015  . Asthma 12/03/2014  . Obesity, unspecified 12/03/2014    Orientation RESPIRATION BLADDER Height & Weight     Self, Time, Situation, Place  O2 Continent Weight:   Height:     BEHAVIORAL SYMPTOMS/MOOD NEUROLOGICAL BOWEL NUTRITION STATUS      Continent Diet(Regular Diet. )  AMBULATORY STATUS COMMUNICATION OF NEEDS Skin   Extensive Assist Verbally Surgical wounds(Incision: Right Knee. )                       Personal Care Assistance Level of Assistance  Bathing, Feeding, Dressing Bathing Assistance: Limited assistance Feeding assistance: Independent Dressing Assistance: Limited assistance     Functional Limitations Info  Sight, Hearing, Speech Sight Info: Adequate Hearing Info: Adequate Speech Info: Adequate    SPECIAL CARE FACTORS FREQUENCY  PT (By licensed PT), OT (By licensed OT)     PT Frequency: (5) OT Frequency: (5)            Contractures      Additional Factors Info  Code  Status, Allergies Code Status Info: (Full Code. ) Allergies Info: (Codeine, Gentamicin, Oxycodone, Tramadol, Bisacodyl, Chlorhexidine, Clarithromycin, Sulfa Antibiotics, Sulfamethoxazole-trimethoprim)           Current Medications (11/21/2017):  This is the current hospital active medication list No current facility-administered medications for this encounter.      Discharge Medications: Please see discharge summary for a list of discharge medications.  Relevant Imaging Results:  Relevant Lab Results:   Additional Information (SSN: [id number])  Alben Jepsen, Darleen CrockerBailey M, LCSW

## 2017-11-21 NOTE — Op Note (Signed)
OPERATIVE NOTE  DATE OF SURGERY:  11/21/2017  PATIENT NAME:  Rubby Barbary Murata   DOB: [birth year]  MRN: [id number]  PRE-OPERATIVE DIAGNOSIS: Degenerative arthrosis of the right knee, primary  POST-OPERATIVE DIAGNOSIS:  Same  PROCEDURE:  Right total knee arthroplasty using computer-assisted navigation  SURGEON:  Jena Gauss. M.D.  ASSISTANT:  Van Clines, PA (present and scrubbed throughout the case, critical for assistance with exposure, retraction, instrumentation, and closure)  ANESTHESIA: spinal  ESTIMATED BLOOD LOSS: 50 mL  FLUIDS REPLACED: 1300 mL of crystalloid  TOURNIQUET TIME: 93 minutes  DRAINS: 2 medium Hemovac drains  SOFT TISSUE RELEASES: Anterior cruciate ligament, posterior cruciate ligament, deep medial collateral ligament, patellofemoral ligament  IMPLANTS UTILIZED: DePuy Attune size 5N posterior stabilized femoral component (cemented), size 4 rotating platform tibial component (cemented), 35 mm medialized dome patella (cemented), and a 7 mm stabilized rotating platform polyethylene insert.  INDICATIONS FOR SURGERY: AYAKO TAPANES is a 67 y.o. year old female with a long history of progressive knee pain. X-rays demonstrated severe degenerative changes in tricompartmental fashion. The patient had not seen any significant improvement despite conservative nonsurgical intervention. After discussion of the risks and benefits of surgical intervention, the patient expressed understanding of the risks benefits and agree with plans for total knee arthroplasty.   The risks, benefits, and alternatives were discussed at length including but not limited to the risks of infection, bleeding, nerve injury, stiffness, blood clots, the need for revision surgery, cardiopulmonary complications, among others, and they were willing to proceed.  PROCEDURE IN DETAIL: The patient was brought into the operating room and, after adequate spinal anesthesia was achieved, a tourniquet was placed on  the patient's upper thigh. The patient's knee and leg were cleaned and prepped with alcohol and DuraPrep and draped in the usual sterile fashion. A "timeout" was performed as per usual protocol. The lower extremity was exsanguinated using an Esmarch, and the tourniquet was inflated to 300 mmHg. An anterior longitudinal incision was made followed by a standard mid vastus approach. The deep fibers of the medial collateral ligament were elevated in a subperiosteal fashion off of the medial flare of the tibia so as to maintain a continuous soft tissue sleeve. The patella was subluxed laterally and the patellofemoral ligament was incised. Inspection of the knee demonstrated severe degenerative changes with full-thickness loss of articular cartilage. Osteophytes were debrided using a rongeur. Anterior and posterior cruciate ligaments were excised. Two 4.0 mm Schanz pins were inserted in the femur and into the tibia for attachment of the array of trackers used for computer-assisted navigation. Hip center was identified using a circumduction technique. Distal landmarks were mapped using the computer. The distal femur and proximal tibia were mapped using the computer. The distal femoral cutting guide was positioned using computer-assisted navigation so as to achieve a 5 distal valgus cut. The femur was sized and it was felt that a size 5N femoral component was appropriate. A size 5 femoral cutting guide was positioned and the anterior cut was performed and verified using the computer. This was followed by completion of the posterior and chamfer cuts. Femoral cutting guide for the central box was then positioned in the center box cut was performed.  Attention was then directed to the proximal tibia. Medial and lateral menisci were excised. The extramedullary tibial cutting guide was positioned using computer-assisted navigation so as to achieve a 0 varus-valgus alignment and 3 posterior slope. The cut was performed and  verified using the computer. The proximal tibia  was sized and it was felt that a size 4 tibial tray was appropriate. Tibial and femoral trials were inserted followed by insertion of a 7 mm polyethylene insert. This allowed for excellent mediolateral soft tissue balancing both in flexion and in full extension. Finally, the patella was cut and prepared so as to accommodate a 35 mm medialized dome patella. A patella trial was placed and the knee was placed through a range of motion with excellent patellar tracking appreciated. The femoral trial was removed after debridement of posterior osteophytes. The central post-hole for the tibial component was reamed followed by insertion of a keel punch. Tibial trials were then removed. Cut surfaces of bone were irrigated with copious amounts of normal saline with antibiotic solution using pulsatile lavage and then suctioned dry. Polymethylmethacrylate cement was prepared in the usual fashion using a vacuum mixer. Cement was applied to the cut surface of the proximal tibia as well as along the undersurface of a size 4 rotating platform tibial component. Tibial component was positioned and impacted into place. Excess cement was removed using Personal assistantreer elevators. Cement was then applied to the cut surfaces of the femur as well as along the posterior flanges of the size 5N femoral component. The femoral component was positioned and impacted into place. Excess cement was removed using Personal assistantreer elevators. A 7 mm polyethylene trial was inserted and the knee was brought into full extension with steady axial compression applied. Finally, cement was applied to the backside of a 35 mm medialized dome patella and the patellar component was positioned and patellar clamp applied. Excess cement was removed using Personal assistantreer elevators. After adequate curing of the cement, the tourniquet was deflated after a total tourniquet time of 93 minutes. Hemostasis was achieved using electrocautery. The knee was  irrigated with copious amounts of normal saline with antibiotic solution using pulsatile lavage and then suctioned dry. 20 mL of 1.3% Exparel and 60 mL of 0.25% Marcaine in 40 mL of normal saline was injected along the posterior capsule, medial and lateral gutters, and along the arthrotomy site. A 7 mm stabilized rotating platform polyethylene insert was inserted and the knee was placed through a range of motion with excellent mediolateral soft tissue balancing appreciated and excellent patellar tracking noted. 2 medium drains were placed in the wound bed and brought out through separate stab incisions. The medial parapatellar portion of the incision was reapproximated using interrupted sutures of #1 Vicryl. Subcutaneous tissue was approximated in layers using first #0 Vicryl followed #2-0 Vicryl. The skin was approximated with skin staples. A sterile dressing was applied.  The patient tolerated the procedure well and was transported to the recovery room in stable condition.    Mahi Zabriskie P. Angie FavaHooten, Jr., M.D.

## 2017-11-21 NOTE — Anesthesia Post-op Follow-up Note (Signed)
Anesthesia QCDR form completed.        

## 2017-11-21 NOTE — H&P (Signed)
The patient has been re-examined, and the chart reviewed, and there have been no interval changes to the documented history and physical.    The risks, benefits, and alternatives have been discussed at length. The patient expressed understanding of the risks benefits and agreed with plans for surgical intervention.  James P. Hooten, Jr. M.D.    

## 2017-11-21 NOTE — Transfer of Care (Signed)
Immediate Anesthesia Transfer of Care Note  Patient: Ruth Rojas  Procedure(s) Performed: COMPUTER ASSISTED TOTAL KNEE ARTHROPLASTY (Right )  Patient Location: PACU  Anesthesia Type:Spinal  Level of Consciousness: awake, alert  and oriented  Airway & Oxygen Therapy: Patient connected to nasal cannula oxygen  Post-op Assessment: Post -op Vital signs reviewed and stable  Post vital signs: stable  Last Vitals:  Vitals:   11/21/17 0948 11/21/17 1524  BP: (!) 162/89 127/80  Pulse: 92 96  Resp: 17 16  Temp: (!) 35.8 C   SpO2: 100% 91%    Last Pain:  Vitals:   11/21/17 0948  TempSrc: Temporal  PainSc: 9          Complications: No apparent anesthesia complications

## 2017-11-22 ENCOUNTER — Encounter: Payer: Self-pay | Admitting: Orthopedic Surgery

## 2017-11-22 NOTE — Care Management (Signed)
Lovenox $32.95.

## 2017-11-22 NOTE — Progress Notes (Signed)
Physical Therapy Treatment Patient Details Name: Ruth BillowRhonda B Haughton MRN: [id number] DOB: 1951-08-04 Today's Date: 11/22/2017    History of Present Illness 67yo female pt admitted for acute hospitalization status post R TKR (11/21/17), WBAT. L TKR 07/2017.     PT Comments    Progressive increase in gait distance.  Continues with good control, stance time R LE; min cuing for active use with movement transitions and higher-level gait mechanics.  Bilat UE fatigue most significant barrier to additional gait distance at this time.    Follow Up Recommendations  Home health PT     Equipment Recommendations       Recommendations for Other Services       Precautions / Restrictions Precautions Precautions: Fall Restrictions Weight Bearing Restrictions: Yes RLE Weight Bearing: Weight bearing as tolerated    Mobility  Bed Mobility Overal bed mobility: Modified Independent                Transfers Overall transfer level: Needs assistance Equipment used: Rolling walker (2 wheeled) Transfers: Sit to/from Stand Sit to Stand: Supervision         General transfer comment: cuing for hand placement, tends to pull on RW  Ambulation/Gait   Ambulation Distance (Feet): 160 Feet         General Gait Details: cuing for decreased force of L LE contact (to increase stance time R LE), postural extension and walker position throughout gait cycle   Stairs            Wheelchair Mobility    Modified Rankin (Stroke Patients Only)       Balance Overall balance assessment: Needs assistance Sitting-balance support: No upper extremity supported;Feet supported Sitting balance-Leahy Scale: Good     Standing balance support: Bilateral upper extremity supported Standing balance-Leahy Scale: Fair                              Cognition Arousal/Alertness: Awake/alert Behavior During Therapy: WFL for tasks assessed/performed Overall Cognitive Status: Within Functional  Limits for tasks assessed                                        Exercises Other Exercises Other Exercises: Toilet transfer, ambulatory with RW, close sup; [facility], mod indep; standing balance for hygiene, clothin gmanagement and hand hygiene, mod indep.  Good awareness of limits of stability and overall safety needs.    General Comments        Pertinent Vitals/Pain Pain Assessment: 0-10 Pain Score: 6  Pain Location: R knee Pain Descriptors / Indicators: Aching;Guarding Pain Intervention(s): Limited activity within patient's tolerance;Monitored during session;Repositioned;Patient requesting pain meds-RN notified    Home Living                      Prior Function            PT Goals (current goals can now be found in the care plan section) Acute Rehab PT Goals Patient Stated Goal: to return home PT Goal Formulation: With patient Time For Goal Achievement: 12/06/17 Potential to Achieve Goals: Good Progress towards PT goals: Progressing toward goals    Frequency    BID      PT Plan Current plan remains appropriate    Co-evaluation  AM-PAC PT "6 Clicks" Daily Activity  Outcome Measure  Difficulty turning over in bed (including adjusting bedclothes, sheets and blankets)?: A Little Difficulty moving from lying on back to sitting on the side of the bed? : A Little Difficulty sitting down on and standing up from a chair with arms (e.g., wheelchair, bedside commode, etc,.)?: A Little Help needed moving to and from a bed to chair (including a wheelchair)?: A Little Help needed walking in hospital room?: A Little Help needed climbing 3-5 steps with a railing? : A Little 6 Click Score: 18    End of Session Equipment Utilized During Treatment: Gait belt Activity Tolerance: Patient tolerated treatment well Patient left: in bed;with bed alarm set;with nursing/sitter in room;with call bell/phone within  reach Nurse Communication: Mobility status PT Visit Diagnosis: Difficulty in walking, not elsewhere classified (R26.2);Pain Pain - Right/Left: Right Pain - part of body: Knee     Time: 6578-4696 PT Time Calculation (min) (ACUTE ONLY): 23 min  Charges:  $Gait Training: 8-22 mins $Therapeutic Exercise: 8-22 mins                    G Codes:       Worley Radermacher H. Manson Passey, PT, DPT, NCS 11/22/17, 9:44 PM [phone]

## 2017-11-22 NOTE — Care Management Note (Signed)
Case Management Note  Patient Details  Name: [name] MRN: [id number] Date of Birth: 07-31-1951  Subjective/Objective:                  RNCM met with patient to discuss transition of care. She states she would like [facility] use [facility] like before. She has a rolling walker and also has 3 syringes of Lovenox left over from last visit. She states she spoke with Dr. Marry Guan and will only need 11 injections of '40mg'$  Lovenox this time. She uses [employer] [phone] And appreciates Good Rx coupon. She states her daughter Loma Sousa will provide assistance to her in the home.  Action/Plan: Referral to [facility] with [facility]. Good Rx coupon delivered. Lovenox called in as stated above.  RNCM will follow.   Expected Discharge Date:                  Expected Discharge Plan:     In-House Referral:     Discharge planning Services  CM Consult  Post Acute Care Choice:  Home Health Choice offered to:  Patient  DME Arranged:    DME Agency:     HH Arranged:  PT [place]:  [place]  Status of Service:  In process, will continue to follow  If discussed at Long Length of Stay Meetings, dates discussed:    Additional Comments:  Marshell Garfinkel, RN 11/22/2017, 8:05 AM

## 2017-11-22 NOTE — Anesthesia Postprocedure Evaluation (Signed)
Anesthesia Post Note  Patient: Ruth Rojas  Procedure(s) Performed: COMPUTER ASSISTED TOTAL KNEE ARTHROPLASTY (Right )  Patient location during evaluation: Nursing Unit Anesthesia Type: Spinal Level of consciousness: awake and alert and oriented Pain management: pain level controlled Vital Signs Assessment: post-procedure vital signs reviewed and stable Respiratory status: spontaneous breathing and nonlabored ventilation Cardiovascular status: stable Postop Assessment: no backache, no apparent nausea or vomiting, adequate PO intake and patient able to bend at knees Anesthetic complications: no     Last Vitals:  Vitals:   11/22/17 0003 11/22/17 0345  BP: 132/66 131/61  Pulse: 79 84  Resp: 16   Temp: 36.6 C 37.2 C  SpO2: 96% 95%    Last Pain:  Vitals:   11/22/17 1002  TempSrc:   PainSc: 7                  Zachary GeorgeWeatherly,  Aryona Sill F

## 2017-11-22 NOTE — Progress Notes (Signed)
  Subjective: 1 Day Post-Op Procedure(s) (LRB): COMPUTER ASSISTED TOTAL KNEE ARTHROPLASTY (Right) Patient reports pain as mild.   Patient seen in rounds with Dr. Ernest PineHooten. Patient is well, and has had no acute complaints or problems Plan is to go Home after hospital stay. Negative for chest pain and shortness of breath Fever: no Gastrointestinal: Negative for nausea and vomiting  Objective: Vital signs in last 24 hours: Temp:  [96.4 F (35.8 C)-98.9 F (37.2 C)] 98.9 F (37.2 C) (02/14 0345) Pulse Rate:  [79-102] 84 (02/14 0345) Resp:  [13-18] 16 (02/14 0003) BP: (127-165)/(61-89) 131/61 (02/14 0345) SpO2:  [91 %-100 %] 95 % (02/14 0345) Weight:  [93.4 kg (206 lb)] 93.4 kg (206 lb) (02/13 1812)  Intake/Output from previous day:  Intake/Output Summary (Last 24 hours) at 11/22/2017 0629 Last data filed at 11/22/2017 0525 Gross per 24 hour  Intake 3715 ml  Output 3580 ml  Net 135 ml    Intake/Output this shift: Total I/O In: 1215 [I.V.:1215] Out: 2530 [Urine:2300; Drains:230]  Labs: No results for input(s): HGB in the last 72 hours. No results for input(s): WBC, RBC, HCT, PLT in the last 72 hours. No results for input(s): NA, K, CL, CO2, BUN, CREATININE, GLUCOSE, CALCIUM in the last 72 hours. No results for input(s): LABPT, INR in the last 72 hours.   EXAM General - Patient is Alert and Oriented Extremity - Neurovascular intact Dorsiflexion/Plantar flexion intact Compartment soft Dressing/Incision - clean, dry, no drainage, with the Hemovac intact Motor Function - intact, moving foot and toes well on exam. Able to straight leg raise independently  Past Medical History:  Diagnosis Date  . Arthritis   . Asthma   . Diverticulosis   . Hypertension     Assessment/Plan: 1 Day Post-Op Procedure(s) (LRB): COMPUTER ASSISTED TOTAL KNEE ARTHROPLASTY (Right) Active Problems:   S/P total knee arthroplasty  Estimated body mass index is 37.68 kg/m as calculated from the  following:   Height as of this encounter: 5\' 2"  (1.575 m).   Weight as of this encounter: 93.4 kg (206 lb). Advance diet Up with therapy D/C IV fluids Plan for discharge tomorrow home with home health physical therapy  DVT Prophylaxis - Lovenox, Foot Pumps and TED hose Weight-Bearing as tolerated to right leg  Dedra Skeensodd Keyvin Rison, PA-C Orthopaedic Surgery 11/22/2017, 6:29 AM

## 2017-11-22 NOTE — Progress Notes (Signed)
Alert and oriented. Pain controlled. Tolerated sitting on side of bed without complaints. VSS. Instructed in use of incentive spirometer. Dsg dry and intact with hemovac in place. Bone foam in place all night. No acute distress noted. Will continue to monitor pt.

## 2017-11-22 NOTE — Evaluation (Signed)
Physical Therapy Evaluation Patient Details Name: Janett BillowRhonda B Boulden MRN: [id number] DOB: Jan 09, 1951 Today's Date: 11/22/2017   History of Present Illness  admitted for acute hospitalization status post R TKR (11/21/17), WBAT.  Clinical Impression  Upon evaluation, patient alert and oriented; follows all commands and demonstrates good insight/effort with all functional activities.  R LE with good post-op strength (3/5) and ROM (0-73 degrees); excellent quad activation and control with therex and mobility tasks.  Demonstrates ability to complete bed mobility with mod indep; sit/stand, basic transfers and gait (80') with RW, cga.  Intermittent cuing for walker position and R LE stance time/mechanics; good efforts to incorporate as appropriate. Would benefit from skilled PT to address above deficits and promote optimal return to PLOF; Recommend transition to HHPT upon discharge from acute hospitalization.     Follow Up Recommendations Home health PT    Equipment Recommendations       Recommendations for Other Services       Precautions / Restrictions Precautions Precautions: Fall Restrictions Weight Bearing Restrictions: Yes RLE Weight Bearing: Weight bearing as tolerated      Mobility  Bed Mobility Overal bed mobility: Modified Independent                Transfers Overall transfer level: Needs assistance Equipment used: Rolling walker (2 wheeled) Transfers: Sit to/from Stand Sit to Stand: Supervision;Min guard         General transfer comment: R LE anterior to BOS, slightly impulsive with movement transition  Ambulation/Gait Ambulation/Gait assistance: Min guard Ambulation Distance (Feet): 80 Feet Assistive device: Rolling walker (2 wheeled)       General Gait Details: step to progressing to step through gait pattern; decreased stance time R LE, but good knee control/stability.  Multiple standing rest breaks due to UE fatigue.   Stairs            Wheelchair  Mobility    Modified Rankin (Stroke Patients Only)       Balance Overall balance assessment: Needs assistance Sitting-balance support: No upper extremity supported;Feet supported Sitting balance-Leahy Scale: Good     Standing balance support: Bilateral upper extremity supported Standing balance-Leahy Scale: Fair                               Pertinent Vitals/Pain Pain Assessment: 0-10 Pain Score: 6  Pain Location: R knee Pain Descriptors / Indicators: Aching;Grimacing;Guarding Pain Intervention(s): Limited activity within patient's tolerance;Monitored during session;Repositioned;RN gave pain meds during session;Patient requesting pain meds-RN notified    Home Living Family/patient expects to be discharged to:: Private residence Living Arrangements: Children Available Help at Discharge: Family;Available 24 hours/day Type of Home: House Home Access: Stairs to enter Entrance Stairs-Rails: Right;Left;Can reach both Entrance Stairs-Number of Steps: 7+7 (landing in between) Home Layout: One level        Prior Function Level of Independence: Independent         Comments: Indep with SPC for ADLs, household and community mobility; denies fall history.  Completed course of rehab after previous TKA (07/2017)     Hand Dominance   Dominant Hand: Right    Extremity/Trunk Assessment   Upper Extremity Assessment Upper Extremity Assessment: Overall WFL for tasks assessed    Lower Extremity Assessment Lower Extremity Assessment: (R knee grossly 3/5 throughout, limited by pain, edema and post-op dressing.  Good quad activation and control with all functional activities)       Communication   Communication: No  difficulties  Cognition Arousal/Alertness: Awake/alert Behavior During Therapy: WFL for tasks assessed/performed Overall Cognitive Status: Within Functional Limits for tasks assessed                                        General  Comments      Exercises Total Joint Exercises Goniometric ROM: R knee, 0-73 degrees Other Exercises Other Exercises: Supine LE therex, 1x10, AROM for muscular strength/endurance: ankle pumps, quad sets, SAQs, heel slides, hip abduct/adduct and SLR.  Good quad activation and control; no lag noted with SLR   Assessment/Plan    PT Assessment Patient needs continued PT services  PT Problem List Decreased strength;Decreased range of motion;Decreased activity tolerance;Decreased balance;Decreased mobility;Pain;Decreased skin integrity       PT Treatment Interventions DME instruction;Gait training;Stair training;Functional mobility training;Therapeutic activities;Therapeutic exercise;Balance training;Patient/family education    PT Goals (Current goals can be found in the Care Plan section)  Acute Rehab PT Goals Patient Stated Goal: to return home PT Goal Formulation: With patient Time For Goal Achievement: 12/06/17 Potential to Achieve Goals: Good    Frequency BID   Barriers to discharge        Co-evaluation               AM-PAC PT "6 Clicks" Daily Activity  Outcome Measure Difficulty turning over in bed (including adjusting bedclothes, sheets and blankets)?: A Little Difficulty moving from lying on back to sitting on the side of the bed? : A Little Difficulty sitting down on and standing up from a chair with arms (e.g., wheelchair, bedside commode, etc,.)?: A Little Help needed moving to and from a bed to chair (including a wheelchair)?: A Little Help needed walking in hospital room?: A Little Help needed climbing 3-5 steps with a railing? : A Little 6 Click Score: 18    End of Session Equipment Utilized During Treatment: Gait belt Activity Tolerance: Patient tolerated treatment well Patient left: in chair;with call bell/phone within reach;with chair alarm set Nurse Communication: Mobility status PT Visit Diagnosis: Difficulty in walking, not elsewhere classified  (R26.2);Pain Pain - Right/Left: Right Pain - part of body: Knee    Time: 1610-9604 PT Time Calculation (min) (ACUTE ONLY): 34 min   Charges:   PT Evaluation $PT Eval Low Complexity: 1 Low PT Treatments $Therapeutic Exercise: 8-22 mins   PT G Codes:        Isaack Preble H. Manson Passey, PT, DPT, NCS 11/22/17, 10:36 AM [phone]

## 2017-11-22 NOTE — Progress Notes (Signed)
Clinical Social Worker (CSW) received SNF consult. PT is recommending home health. RN case manager aware of above. Please reconsult if future social work needs arise. CSW signing off.   Chasey Dull, LCSW [phone] 

## 2017-11-22 NOTE — Evaluation (Signed)
Occupational Therapy Evaluation Patient Details Name: Ruth Rojas MRN: [id number] DOB: 21-Feb-1951 Today's Date: 11/22/2017    History of Present Illness 67yo female pt admitted for acute hospitalization status post R TKR (11/21/17), WBAT. L TKR 07/2017.    Clinical Impression   Pt seen for OT evaluation, POD#1 from R TKA, with hx of L TKA in 07/2017. Pt was independent in all ADLs prior to surgery and is eager to return to PLOF.  Pt currently requires minimal assist for LB dressing and bathing while in seated position due to pain and limited AROM of R knee. Pt instructed in AE for LB ADL tasks, compression stocking mgt, polar care mgt, falls prevention strategies, and functional transfer training. Pt verbalized understanding of all instruction provided. Pt plans to have daughter stay with her and assist after this hospitalization. Anticipate safe return home with good home set up and support available. Do not anticipate any skilled OT needs after this hospitalization. No further skilled OT needs at this time, will sign off. Please re-consult if additional needs arise.     Follow Up Recommendations  No OT follow up    Equipment Recommendations  None recommended by OT    Recommendations for Other Services       Precautions / Restrictions Precautions Precautions: Fall Restrictions Weight Bearing Restrictions: Yes RLE Weight Bearing: Weight bearing as tolerated      Mobility Bed Mobility              General bed mobility comments: deferred, up in recliner  Transfers Overall transfer level: Needs assistance Equipment used: Rolling walker (2 wheeled) Transfers: Sit to/from Stand Sit to Stand: Supervision;Min guard         General transfer comment: pt instructed in hand placement and RW placement to maximize safety and stability    Balance Overall balance assessment: Needs assistance Sitting-balance support: No upper extremity supported;Feet supported Sitting  balance-Leahy Scale: Good     Standing balance support: Bilateral upper extremity supported Standing balance-Leahy Scale: Fair                             ADL either performed or assessed with clinical judgement   ADL Overall ADL's : Needs assistance/impaired Eating/Feeding: Independent;Sitting   Grooming: Standing;Min guard   Upper Body Bathing: Sitting;Modified independent   Lower Body Bathing: Sit to/from stand;Min guard   Upper Body Dressing : Sitting;Modified independent   Lower Body Dressing: Sit to/from stand;Minimal assistance Lower Body Dressing Details (indicate cue type and reason): pt instructed in AE for LB dressing Toilet Transfer: RW;Ambulation;Min guard;Comfort height toilet   Toileting- Clothing Manipulation and Hygiene: Independent       Functional mobility during ADLs: Min guard;Rolling walker       Vision Baseline Vision/History: Wears glasses Wears Glasses: At all times Patient Visual Report: No change from baseline       Perception     Praxis      Pertinent Vitals/Pain Pain Assessment: 0-10 Pain Score: 7  Pain Location: R knee Pain Descriptors / Indicators: Aching Pain Intervention(s): Limited activity within patient's tolerance;Monitored during session;Premedicated before session;Ice applied     Hand Dominance Right   Extremity/Trunk Assessment Upper Extremity Assessment Upper Extremity Assessment: Overall WFL for tasks assessed   Lower Extremity Assessment Lower Extremity Assessment: RLE deficits/detail RLE Deficits / Details: grossly 3/5, pain, intact sensation   Cervical / Trunk Assessment Cervical / Trunk Assessment: Normal   Communication Communication Communication:  No difficulties   Cognition Arousal/Alertness: Awake/alert Behavior During Therapy: WFL for tasks assessed/performed Overall Cognitive Status: Within Functional Limits for tasks assessed                                      General Comments       Exercises Other Exercises Other Exercises: Pt instructed in compression stocking mgt, polar care mgt, falls prevention, AE for ADL, and functional transfer training.    Shoulder Instructions      Home Living Family/patient expects to be discharged to:: Private residence Living Arrangements: Children Available Help at Discharge: Family;Available 24 hours/day Type of Home: House Home Access: Stairs to enter Entergy CorporationEntrance Stairs-Number of Steps: 7+7 (landing in between) Entrance Stairs-Rails: Right;Left;Can reach both Home Layout: One level     Bathroom Shower/Tub: Producer, television/film/videoWalk-in shower   Bathroom Toilet: Handicapped height     Home Equipment: Information systems managerhower seat;Adaptive equipment;Bedside commode;Walker - 2 wheels Adaptive Equipment: Reacher        Prior Functioning/Environment Level of Independence: Independent        Comments: Indep with SPC for ADLs, household and community mobility; denies fall history.  Completed course of rehab after previous TKA (07/2017)        OT Problem List:        OT Treatment/Interventions:      OT Goals(Current goals can be found in the care plan section) Acute Rehab OT Goals Patient Stated Goal: to return home OT Goal Formulation: All assessment and education complete, DC therapy  OT Frequency:     Barriers to D/C:            Co-evaluation              AM-PAC PT "6 Clicks" Daily Activity     Outcome Measure Help from another person eating meals?: None Help from another person taking care of personal grooming?: None Help from another person toileting, which includes using toliet, bedpan, or urinal?: A Little Help from another person bathing (including washing, rinsing, drying)?: A Little Help from another person to put on and taking off regular upper body clothing?: None Help from another person to put on and taking off regular lower body clothing?: A Little 6 Click Score: 21   End of Session    Activity  Tolerance: Patient tolerated treatment well Patient left: in chair;with call bell/phone within reach;with chair alarm set;Other (comment)(polar care in place)  OT Visit Diagnosis: Other abnormalities of gait and mobility (R26.89);Pain Pain - Right/Left: Right Pain - part of body: Knee                Time: 1051-1104 OT Time Calculation (min): 13 min Charges:  OT General Charges $OT Visit: 1 Visit OT Evaluation $OT Eval Low Complexity: 1 Low OT Treatments $Self Care/Home Management : 8-22 mins  Richrd PrimeJamie Stiller, MPH, MS, OTR/L ascom [phone] 11/22/17, 11:19 AM

## 2017-11-23 LAB — POCT I-STAT 4, (NA,K, GLUC, HGB,HCT)
Glucose, Bld: 114 mg/dL — ABNORMAL HIGH (ref 65–99)
HEMATOCRIT: 45 % (ref 36.0–46.0)
HEMOGLOBIN: 15.3 g/dL — AB (ref 12.0–15.0)
POTASSIUM: 3.6 mmol/L (ref 3.5–5.1)
SODIUM: 143 mmol/L (ref 135–145)

## 2017-11-23 MED ORDER — TAPENTADOL HCL 50 MG PO TABS: 50.0000 mg | ORAL_TABLET | ORAL | 0 refills | Status: DC | PRN

## 2017-11-23 MED ORDER — HYDROCODONE-ACETAMINOPHEN 5-325 MG PO TABS: 1.0000 | ORAL_TABLET | ORAL | 0 refills | Status: AC | PRN

## 2017-11-23 MED ORDER — ENOXAPARIN SODIUM 40 MG/0.4ML ~~LOC~~ SOLN: 40.0000 mg | SUBCUTANEOUS | 0 refills | Status: DC

## 2017-11-23 MED ORDER — ENOXAPARIN SODIUM 40 MG/0.4ML ~~LOC~~ SOLN: 40.0000 mg | SUBCUTANEOUS | 0 refills | Status: AC

## 2017-11-23 MED ORDER — CELECOXIB 200 MG PO CAPS: 200.0000 mg | ORAL_CAPSULE | Freq: Two times a day (BID) | ORAL | 1 refills | Status: AC

## 2017-11-23 NOTE — Progress Notes (Signed)
Pt ready for d/c home today after BM per MD. Pt did have BM after suppository. Discharge instructions and prescriptions reviewed with pt, all questions answered. Pt met PT goals, has needed equipment at home. PIV removed, belongings gathered. Pt will be assisted to car via NT.   [place], Jerry Caras

## 2017-11-23 NOTE — Care Management (Signed)
Patient discharging to home today followed by [facility]. I have informed patient of Lovenox cost to her and she agrees. Barbara CowerJason with [facility] notified of patient discharge. No other RNCM needs.

## 2017-11-23 NOTE — Discharge Summary (Signed)
Physician Discharge Summary  Subjective: 2 Days Post-Op Procedure(s) (LRB): COMPUTER ASSISTED TOTAL KNEE ARTHROPLASTY (Right) Patient reports pain as mild.   Patient seen in rounds with Dr. Ernest Pine. Patient is well, and has had no acute complaints or problems Patient is ready to go home with home health physical therapy  Physician Discharge Summary  Patient ID: Ruth Rojas MRN: [id number] DOB/AGE: January 30, 1951 67 y.o.  Admit date: 11/21/2017 Discharge date: 11/23/2017  Admission Diagnoses:  Discharge Diagnoses:  Active Problems:   S/P total knee arthroplasty   Discharged Condition: good  Hospital Course: The patient is postop day 2 from a left total knee replacement. The patient did very well with physical therapy and ambulated 160 feet. Her vitals have remained stable with no fever. She has to have a bowel movement before discharge.  Treatments: surgery:  Right total knee arthroplasty using computer-assisted navigation  SURGEON:  Jena Gauss. M.D.  ASSISTANT:  Van Clines, PA (present and scrubbed throughout the case, critical for assistance with exposure, retraction, instrumentation, and closure)  ANESTHESIA: spinal  ESTIMATED BLOOD LOSS: 50 mL  FLUIDS REPLACED: 1300 mL of crystalloid  TOURNIQUET TIME: 93 minutes  DRAINS: 2 medium Hemovac drains  SOFT TISSUE RELEASES: Anterior cruciate ligament, posterior cruciate ligament, deep medial collateral ligament, patellofemoral ligament  IMPLANTS UTILIZED: DePuy Attune size 5N posterior stabilized femoral component (cemented), size 4 rotating platform tibial component (cemented), 35 mm medialized dome patella (cemented), and a 7 mm stabilized rotating platform polyethylene insert.    Discharge Exam: Blood pressure 140/66, pulse 77, temperature 98.7 F (37.1 C), temperature source Oral, resp. rate 18, height 5\' 2"  (1.575 m), weight 93.4 kg (206 lb), SpO2 92 %.   Disposition: 06-Home-Health Care  Svc   Allergies as of 11/23/2017      Reactions   Codeine Nausea And Vomiting, Other (See Comments)   Constipation   Gentamicin Other (See Comments)   Eyedrops caused red, irritated and dry eyes   Oxycodone Nausea And Vomiting   Tramadol Nausea And Vomiting   Bisacodyl Rash   Chlorhexidine Rash   Clarithromycin Rash   Sulfa Antibiotics Rash   Sulfamethoxazole-trimethoprim Rash      Medication List    STOP taking these medications   aspirin EC 81 MG tablet     TAKE these medications   acetaminophen 650 MG CR tablet Commonly known as:  TYLENOL Take 1,300 mg by mouth every 8 (eight) hours as needed for pain.   albuterol 108 (90 Base) MCG/ACT inhaler Commonly known as:  PROVENTIL HFA;VENTOLIN HFA Inhale 2 puffs into the lungs every 6 (six) hours as needed for wheezing or shortness of breath.   PROAIR HFA 108 (90 Base) MCG/ACT inhaler Generic drug:  albuterol Inhale 2 puffs into the lungs every 6 (six) hours as needed for wheezing.   ARNUITY ELLIPTA 200 MCG/ACT Aepb Generic drug:  Fluticasone Furoate Inhale 1 puff into the lungs daily as needed (for shortness of breath or wheezing).   ARTIFICIAL TEAR OP Place 1 drop into both eyes 2 (two) times daily as needed (for dry eyes).   BENEFIBER ON THE GO Powd Take 1 packet by mouth 2 (two) times daily.   Biotin 300 MCG Tabs Take 1 tablet by mouth daily.   Biotin Plus Keratin 10000-100 MCG-MG Tabs Take 1 tablet by mouth daily.   celecoxib 200 MG capsule Commonly known as:  CELEBREX Take 1 capsule (200 mg total) by mouth every 12 (twelve) hours.   docusate sodium 100 MG  capsule Commonly known as:  COLACE Take 100 mg by mouth 2 (two) times daily.   enoxaparin 40 MG/0.4ML injection Commonly known as:  LOVENOX Inject 0.4 mLs (40 mg total) into the skin daily.   hydrochlorothiazide 12.5 MG capsule Commonly known as:  MICROZIDE Take 12.5 mg by mouth daily.   loratadine 10 MG tablet Commonly known as:  CLARITIN Take  10 mg by mouth daily as needed for allergies.   Melatonin 3 MG Tabs Take 1.5 mg by mouth at bedtime.   montelukast 10 MG tablet Commonly known as:  SINGULAIR Take 10 mg by mouth at bedtime.   multivitamin with minerals Tabs tablet Take 1 tablet by mouth daily with supper.   RA POTASSIUM GLUCONATE 595 (99 K) MG Tabs tablet Generic drug:  potassium gluconate Take 1 tablet by mouth daily.   tapentadol 50 MG tablet Commonly known as:  NUCYNTA Take 1-2 tablets (50-100 mg total) by mouth every 4 (four) hours as needed for moderate pain.   vitamin C 500 MG tablet Commonly known as:  ASCORBIC ACID Take 1,500 mg by mouth daily.   vitamin E 400 UNIT capsule Take 400 Units by mouth daily.            Durable Medical Equipment  (From admission, onward)        Start     Ordered   11/21/17 1703  DME Walker rolling  Once    Question:  Patient needs a walker to treat with the following condition  Answer:  Total knee replacement status   11/21/17 1702   11/21/17 1703  DME Bedside commode  Once    Question:  Patient needs a bedside commode to treat with the following condition  Answer:  Total knee replacement status   11/21/17 1702     Follow-up Information    Tera Partridge, PA Follow up on 12/06/2017.   Specialty:  Physician Assistant Why:  at 2:15pm Contact information: [address] [facility] [place] [address] [phone]        Donato Heinz, MD Follow up on 01/03/2018.   Specialty:  Orthopedic Surgery Why:  at 11:15am Contact information: [address] [facility] [place] [address] [phone]           Signed: Lenard Forth, Netanel Yannuzzi 11/23/2017, 7:27 AM   Objective: Vital signs in last 24 hours: Temp:  [98.3 F (36.8 C)-98.7 F (37.1 C)] 98.7 F (37.1 C) (02/15 0536) Pulse Rate:  [72-77] 77 (02/15 0536) Resp:  [18] 18 (02/15 0536) BP: (122-140)/(60-66) 140/66 (02/15 0536) SpO2:  [92 %-94 %] 92 % (02/15  0536)  Intake/Output from previous day:  Intake/Output Summary (Last 24 hours) at 11/23/2017 0727 Last data filed at 11/23/2017 0547 Gross per 24 hour  Intake 360 ml  Output 760 ml  Net -400 ml    Intake/Output this shift: No intake/output data recorded.  Labs: No results for input(s): HGB in the last 72 hours. No results for input(s): WBC, RBC, HCT, PLT in the last 72 hours. No results for input(s): NA, K, CL, CO2, BUN, CREATININE, GLUCOSE, CALCIUM in the last 72 hours. No results for input(s): LABPT, INR in the last 72 hours.  EXAM: General - Patient is Alert and Oriented Extremity - Neurovascular intact Dorsiflexion/Plantar flexion intact No cellulitis present Compartment soft Incision - clean, dry, blood tinged drainage Motor Function -  dorsiflexion and plantarflexion intact. Able to do a straight leg raise.  Assessment/Plan: 2 Days Post-Op  Procedure(s) (LRB): COMPUTER ASSISTED TOTAL KNEE ARTHROPLASTY (Right) Procedure(s) (LRB): COMPUTER ASSISTED TOTAL KNEE ARTHROPLASTY (Right) Past Medical History:  Diagnosis Date  . Arthritis   . Asthma   . Diverticulosis   . Hypertension    Active Problems:   S/P total knee arthroplasty  Estimated body mass index is 37.68 kg/m as calculated from the following:   Height as of this encounter: 5\' 2"  (1.575 m).   Weight as of this encounter: 93.4 kg (206 lb). Advance diet  Discharge home with home health physical therapy today Diet - Regular diet Follow up - in 2 weeks Activity - WBAT Disposition - Home Condition Upon Discharge - Good DVT Prophylaxis - Lovenox and TED hose  Dedra Skeensodd Caspar Favila, PA-C Orthopaedic Surgery 11/23/2017, 7:27 AM

## 2017-11-23 NOTE — Progress Notes (Signed)
  Subjective: 2 Days Post-Op Procedure(s) (LRB): COMPUTER ASSISTED TOTAL KNEE ARTHROPLASTY (Right) Patient reports pain as mild.   Patient seen in rounds with Dr. Ernest PineHooten. Patient is well, and has had no acute complaints or problems Plan is to go Home after hospital stay. Negative for chest pain and shortness of breath Fever: no Gastrointestinal: Negative for nausea and vomiting  Objective: Vital signs in last 24 hours: Temp:  [98.3 F (36.8 C)-98.7 F (37.1 C)] 98.7 F (37.1 C) (02/15 0536) Pulse Rate:  [72-77] 77 (02/15 0536) Resp:  [18] 18 (02/15 0536) BP: (122-140)/(60-66) 140/66 (02/15 0536) SpO2:  [92 %-94 %] 92 % (02/15 0536)  Intake/Output from previous day:  Intake/Output Summary (Last 24 hours) at 11/23/2017 0722 Last data filed at 11/23/2017 0547 Gross per 24 hour  Intake 360 ml  Output 760 ml  Net -400 ml    Intake/Output this shift: No intake/output data recorded.  Labs: No results for input(s): HGB in the last 72 hours. No results for input(s): WBC, RBC, HCT, PLT in the last 72 hours. No results for input(s): NA, K, CL, CO2, BUN, CREATININE, GLUCOSE, CALCIUM in the last 72 hours. No results for input(s): LABPT, INR in the last 72 hours.   EXAM General - Patient is Alert and Oriented Extremity - Neurovascular intact Dorsiflexion/Plantar flexion intact Compartment soft Dressing/Incision - clean, dry, no drainage, with the Hemovac removed. Scant dry drainage. Motor Function - intact, moving foot and toes well on exam. Able to straight leg raise independently. Ambulated 160 feet with physical therapy  Past Medical History:  Diagnosis Date  . Arthritis   . Asthma   . Diverticulosis   . Hypertension     Assessment/Plan: 2 Days Post-Op Procedure(s) (LRB): COMPUTER ASSISTED TOTAL KNEE ARTHROPLASTY (Right) Active Problems:   S/P total knee arthroplasty  Estimated body mass index is 37.68 kg/m as calculated from the following:   Height as of this  encounter: 5\' 2"  (1.575 m).   Weight as of this encounter: 93.4 kg (206 lb). Advance diet. Discharge home today.  Bowel movement before discharge.   DVT Prophylaxis - Lovenox, Foot Pumps and TED hose Weight-Bearing as tolerated to right leg  Dedra Skeensodd Monserrate Blaschke, PA-C Orthopaedic Surgery 11/23/2017, 7:22 AM

## 2017-11-23 NOTE — Progress Notes (Signed)
Physical Therapy Treatment Patient Details Name: Ruth Rojas MRN: [id number] DOB: [birth year] Today's Date: 11/23/2017    History of Present Illness 66yo female pt admitted for acute hospitalization status post R TKR (11/21/17), WBAT. L TKR 07/2017.     PT Comments    Pt awake and in bed upon PT arrival. Pt was motivated to ambulate and climb stairs.  Pt demonstrated progression in gait distance to 200 ft with RW w/ good control and gait pattern; min cueing for push through BUE in order to offload RLE during stance phase. Pt required standing rest breaks due to UE fatigue. Pt demonstrated good progression of ROM of R knee 0-93 degrees.  Nursing notified of mobility status. Pt able to navigate stairs safely. Pt appears safe to DC home when medically appropriate.     Follow Up Recommendations  Home health PT     Equipment Recommendations  Rolling walker with 5" wheels    Recommendations for Other Services       Precautions / Restrictions Precautions Precautions: Fall Restrictions Weight Bearing Restrictions: Yes RLE Weight Bearing: Weight bearing as tolerated    Mobility  Bed Mobility Overal bed mobility: Modified Independent             General bed mobility comments: Pt required extra time and effort  Transfers Overall transfer level: Needs assistance Equipment used: Rolling walker (2 wheeled) Transfers: Sit to/from Stand Sit to Stand: Min guard         General transfer comment: cuing for hand placement, CGA provided but not required  Ambulation/Gait Ambulation/Gait assistance: Min guard, CGA provided but not required Ambulation Distance (Feet): 200 Feet Assistive device: Rolling walker (2 wheeled) Gait Pattern/deviations: Step-to pattern     General Gait Details: cueing to use push through UE on walker (to offload R LE during stance)   Stairs Stairs: Yes   Stair Management: Forwards;Two rails Number of Stairs: 12 General stair comments: Pt  demonstrated good understanding of stair climbing pattern and safety precautions; Pt required a short rest break due to UE fatigue; Pt did not display or report SOB.  Wheelchair Mobility    Modified Rankin (Stroke Patients Only)       Balance Overall balance assessment: Needs assistance Sitting-balance support: No upper extremity supported;Feet supported Sitting balance-Leahy Scale: Good Sitting balance - Comments: Pt demonstrated stability during EOB sitting. Postural control: Left lateral lean Standing balance support: Bilateral upper extremity supported Standing balance-Leahy Scale: Fair Standing balance comment: Pt demonstrates overall good balance in standing but has a L lateral lean to offload R LE due to pain.                            Cognition                                              Exercises Total Joint Exercises Heel Slides: AAROM;Right;10 reps;Supine Goniometric ROM: R knee, 0-93 degrees    General Comments        Pertinent Vitals/Pain Pain Assessment: 0-10 Pain Score: 7  Pain Location: R knee Pain Descriptors / Indicators: Aching;Guarding Pain Intervention(s): Limited activity within patient's tolerance;Monitored during session;Premedicated before session    Home Living                      Prior Function  PT Goals (current goals can now be found in the care plan section) Acute Rehab PT Goals Patient Stated Goal: to return home PT Goal Formulation: With patient Time For Goal Achievement: 12/06/17 Potential to Achieve Goals: Good Progress towards PT goals: Progressing toward goals    Frequency    BID      PT Plan Current plan remains appropriate    Co-evaluation              AM-PAC PT "6 Clicks" Daily Activity  Outcome Measure  Difficulty turning over in bed (including adjusting bedclothes, sheets and blankets)?: A Little Difficulty moving from lying on back to sitting on the  side of the bed? : A Little Difficulty sitting down on and standing up from a chair with arms (e.g., wheelchair, bedside commode, etc,.)?: A Little Help needed moving to and from a bed to chair (including a wheelchair)?: A Little Help needed walking in hospital room?: A Little Help needed climbing 3-5 steps with a railing? : A Little 6 Click Score: 18    End of Session Equipment Utilized During Treatment: Gait belt Activity Tolerance: Patient limited by fatigue(Pt required rest breaks during ambulation due to UE fatigue.) Patient left: in bed;with bed alarm set;with call bell/phone within reach;with SCD's reapplied: R ankle put in bone foam and B ankles elevated with towel rolls. Polar care in place and activated. Nurse Communication: Mobility status PT Visit Diagnosis: Difficulty in walking, not elsewhere classified (R26.2);Pain;Muscle weakness (generalized) (M62.81);Unsteadiness on feet (R26.81);Other abnormalities of gait and mobility (R26.89) Pain - Right/Left: Right Pain - part of body: Knee     Time: [id number] PT Time Calculation (min) (ACUTE ONLY): 38 min  Charges:                       G Codes:      Dosha Broshears Mondrian-Pardue, SPT 11/23/2017, 1:45 PM

## 2018-09-09 ENCOUNTER — Other Ambulatory Visit: Payer: Self-pay | Admitting: Internal Medicine

## 2018-09-09 DIAGNOSIS — Z1231 Encounter for screening mammogram for malignant neoplasm of breast: Secondary | ICD-10-CM

## 2018-09-25 ENCOUNTER — Ambulatory Visit
Admission: RE | Admit: 2018-09-25 | Discharge: 2018-09-25 | Disposition: A | Discharge status: Home / Self Care | Payer: Medicare Other | Source: Ambulatory Visit | Attending: Internal Medicine | Admitting: Internal Medicine

## 2018-09-25 DIAGNOSIS — Z1231 Encounter for screening mammogram for malignant neoplasm of breast: Secondary | ICD-10-CM | POA: Insufficient documentation

## 2019-05-16 IMAGING — MG DIGITAL SCREENING BILATERAL MAMMOGRAM WITH TOMO AND CAD
8 series · 8 of 24 positions shown · non-contrast
Comparison: Previous exam(s).

CLINICAL DATA: Screening.

EXAM:
DIGITAL SCREENING BILATERAL MAMMOGRAM WITH TOMO AND CAD

[R MLO synth-2D]
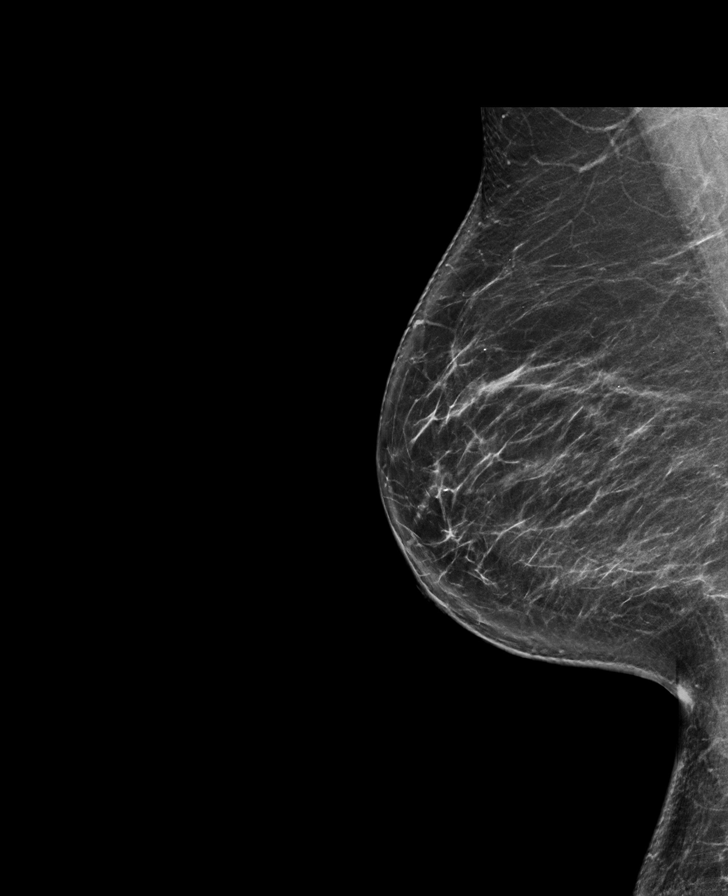

[L CC synth-2D]
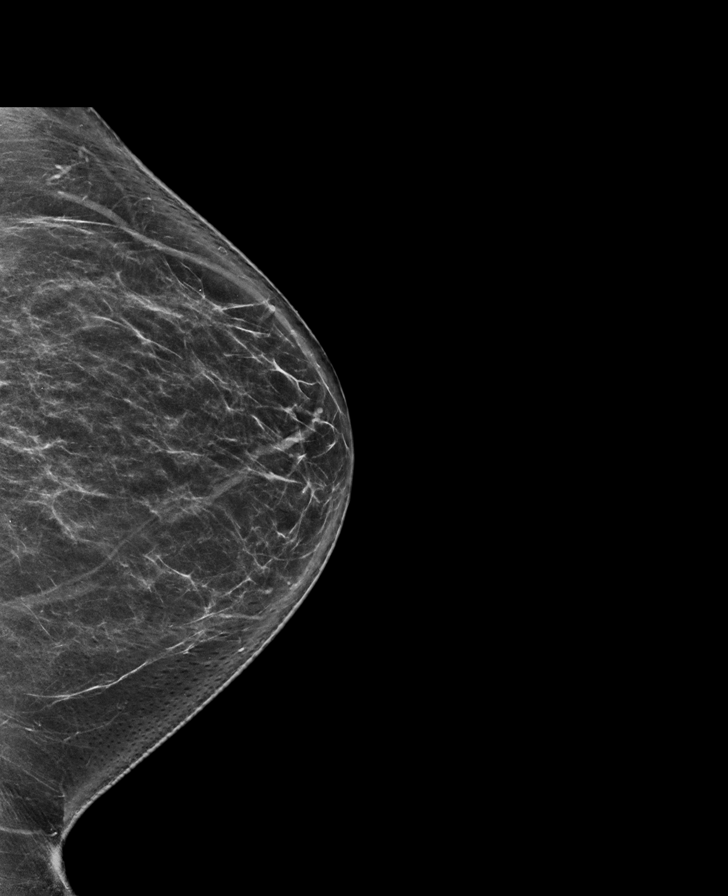

[L MLO synth-2D]
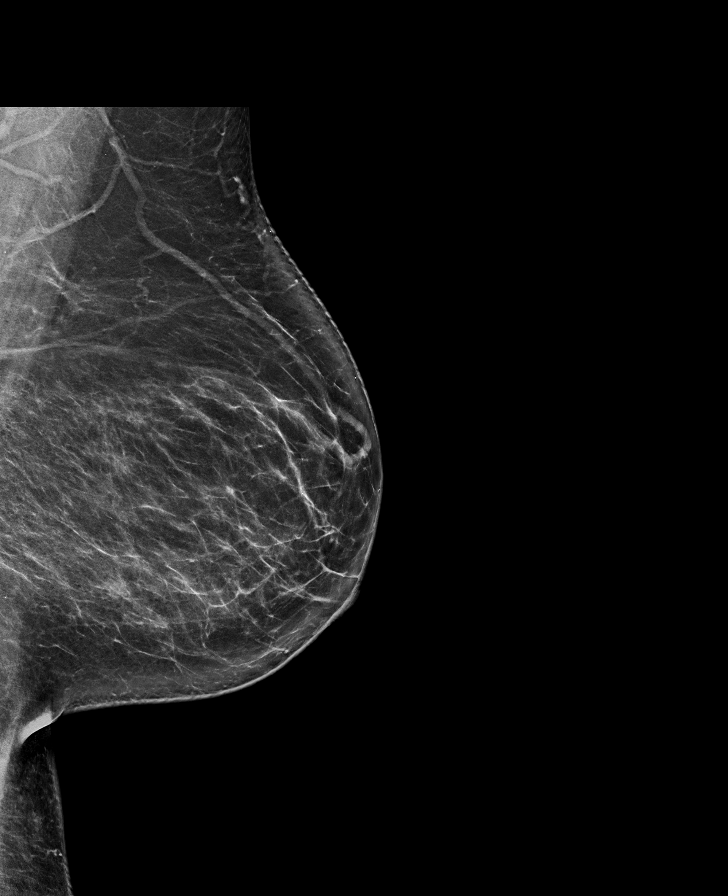

[R CC synth-2D]
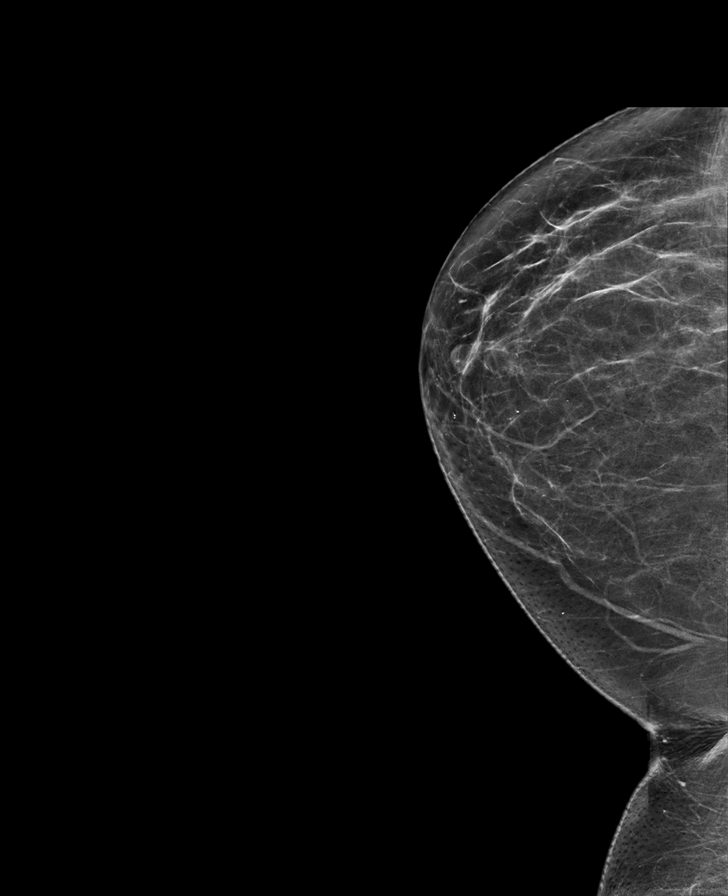

[L CC tomo · tomo slice 39/77.0]
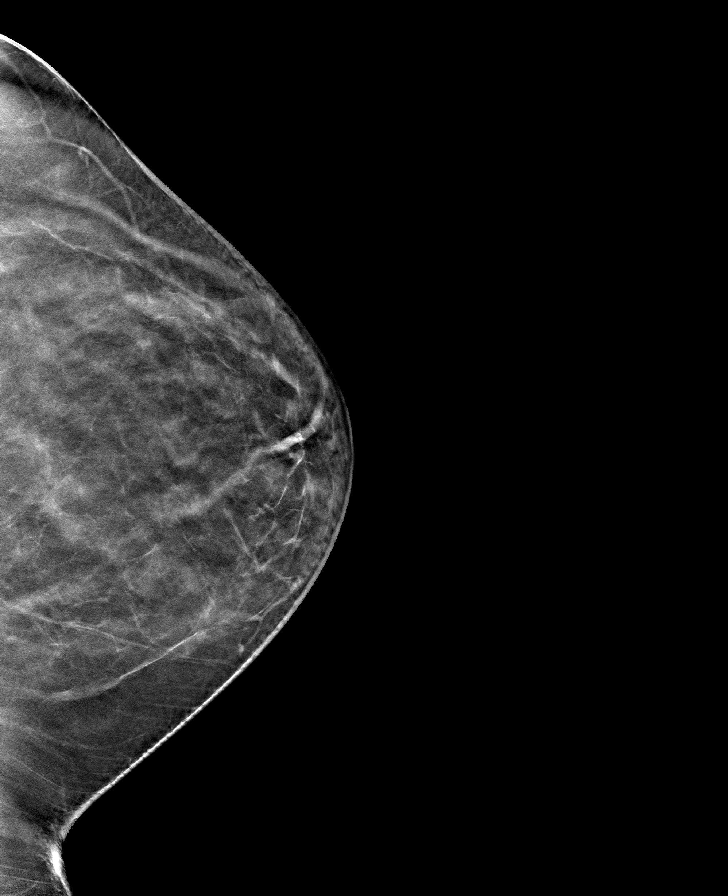

[R CC tomo · tomo slice 34/67.0]
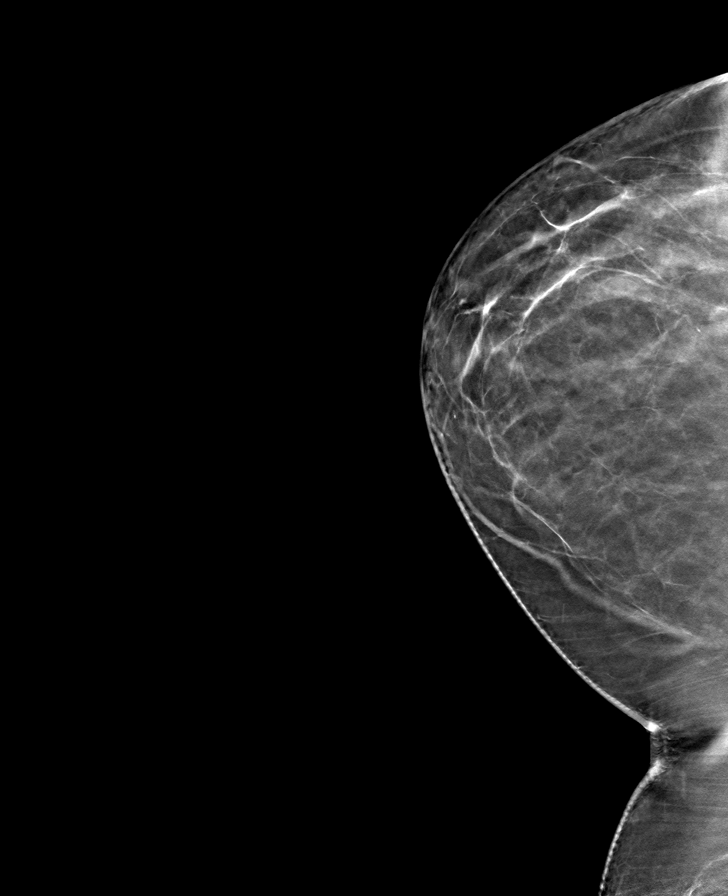

[R MLO tomo · tomo slice 41/82.0]
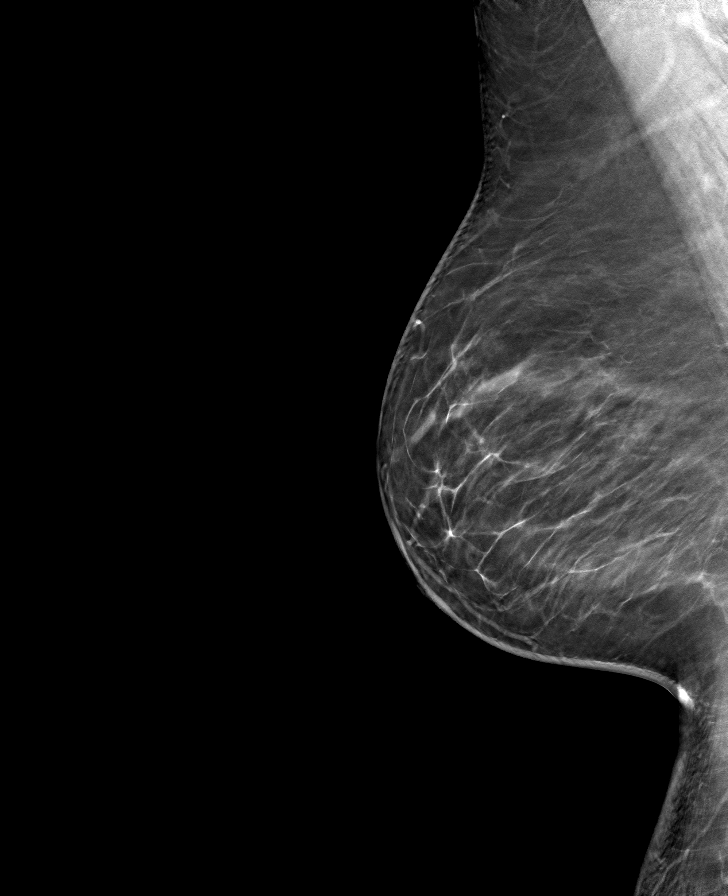

[L MLO tomo · tomo slice 42/83.0]
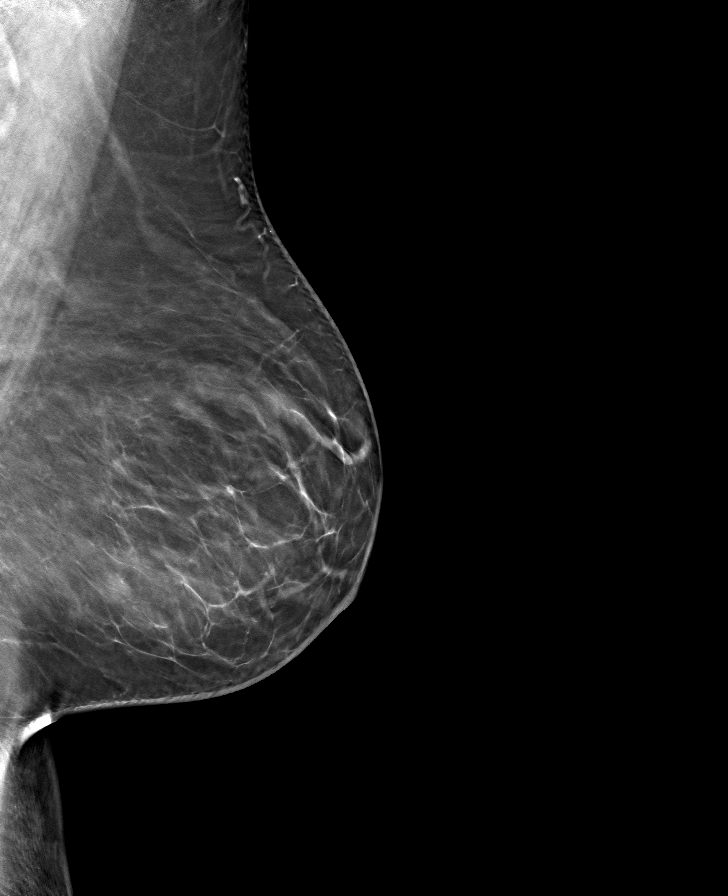

[8 of 24 positions shown; findings below may reference images not displayed]

ACR Breast Density Category b: There are scattered areas of
fibroglandular density.
FINDINGS: There are no findings suspicious for malignancy. Images were
processed with CAD.
IMPRESSION: No mammographic evidence of malignancy. A result letter of this
screening mammogram will be mailed directly to the patient.

RECOMMENDATION:
Screening mammogram in one year. (Code:[id number])

BI-RADS CATEGORY  1: Negative.

## 2019-11-24 ENCOUNTER — Other Ambulatory Visit: Payer: Self-pay | Admitting: Internal Medicine

## 2019-11-24 DIAGNOSIS — Z1231 Encounter for screening mammogram for malignant neoplasm of breast: Secondary | ICD-10-CM

## 2020-02-09 ENCOUNTER — Ambulatory Visit
Admission: RE | Admit: 2020-02-09 | Discharge: 2020-02-09 | Disposition: A | Discharge status: Home / Self Care | Payer: Medicare Other | Source: Ambulatory Visit | Attending: Internal Medicine | Admitting: Internal Medicine

## 2020-02-09 DIAGNOSIS — Z1231 Encounter for screening mammogram for malignant neoplasm of breast: Secondary | ICD-10-CM | POA: Insufficient documentation

## 2021-01-31 ENCOUNTER — Other Ambulatory Visit: Payer: Self-pay | Admitting: Internal Medicine

## 2021-02-03 ENCOUNTER — Other Ambulatory Visit: Payer: Self-pay | Admitting: Internal Medicine

## 2021-02-03 DIAGNOSIS — Z1231 Encounter for screening mammogram for malignant neoplasm of breast: Secondary | ICD-10-CM

## 2021-02-10 ENCOUNTER — Ambulatory Visit
Admission: RE | Admit: 2021-02-10 | Discharge: 2021-02-10 | Disposition: A | Discharge status: Home / Self Care | Payer: Medicare Other | Source: Ambulatory Visit | Attending: Internal Medicine | Admitting: Internal Medicine

## 2021-02-10 ENCOUNTER — Other Ambulatory Visit: Payer: Self-pay

## 2021-02-10 DIAGNOSIS — Z1231 Encounter for screening mammogram for malignant neoplasm of breast: Secondary | ICD-10-CM | POA: Insufficient documentation

## 2021-02-11 ENCOUNTER — Other Ambulatory Visit: Payer: Self-pay | Admitting: Internal Medicine

## 2021-02-11 DIAGNOSIS — N6459 Other signs and symptoms in breast: Secondary | ICD-10-CM

## 2021-02-15 ENCOUNTER — Ambulatory Visit
Admission: RE | Admit: 2021-02-15 | Discharge: 2021-02-15 | Disposition: A | Discharge status: Home / Self Care | Payer: Medicare Other | Source: Ambulatory Visit | Attending: Internal Medicine | Admitting: Internal Medicine

## 2021-02-15 ENCOUNTER — Other Ambulatory Visit: Payer: Self-pay

## 2021-02-15 DIAGNOSIS — N6459 Other signs and symptoms in breast: Secondary | ICD-10-CM | POA: Diagnosis not present

## 2022-03-27 ENCOUNTER — Other Ambulatory Visit: Payer: Self-pay | Admitting: Internal Medicine

## 2022-03-27 DIAGNOSIS — Z1231 Encounter for screening mammogram for malignant neoplasm of breast: Secondary | ICD-10-CM

## 2022-05-11 ENCOUNTER — Ambulatory Visit
Admission: RE | Admit: 2022-05-11 | Discharge: 2022-05-11 | Disposition: A | Discharge status: Home / Self Care | Payer: Medicare Other | Source: Ambulatory Visit | Attending: Internal Medicine | Admitting: Internal Medicine

## 2022-05-11 DIAGNOSIS — Z1231 Encounter for screening mammogram for malignant neoplasm of breast: Secondary | ICD-10-CM | POA: Diagnosis present

## 2023-05-01 ENCOUNTER — Other Ambulatory Visit: Payer: Self-pay | Admitting: Internal Medicine

## 2023-05-01 DIAGNOSIS — Z1231 Encounter for screening mammogram for malignant neoplasm of breast: Secondary | ICD-10-CM

## 2023-06-13 ENCOUNTER — Ambulatory Visit
Admission: RE | Admit: 2023-06-13 | Discharge: 2023-06-13 | Disposition: A | Discharge status: Home / Self Care | Payer: Medicare Other | Source: Ambulatory Visit | Attending: Internal Medicine | Admitting: Internal Medicine

## 2023-06-13 DIAGNOSIS — Z1231 Encounter for screening mammogram for malignant neoplasm of breast: Secondary | ICD-10-CM | POA: Insufficient documentation

## 2024-06-26 ENCOUNTER — Other Ambulatory Visit: Payer: Self-pay | Admitting: Internal Medicine

## 2024-06-26 DIAGNOSIS — Z1231 Encounter for screening mammogram for malignant neoplasm of breast: Secondary | ICD-10-CM

## 2024-07-16 ENCOUNTER — Ambulatory Visit
Admission: RE | Admit: 2024-07-16 | Discharge: 2024-07-16 | Disposition: A | Discharge status: Home / Self Care | Source: Ambulatory Visit | Attending: Internal Medicine | Admitting: Internal Medicine

## 2024-07-16 DIAGNOSIS — Z1231 Encounter for screening mammogram for malignant neoplasm of breast: Secondary | ICD-10-CM | POA: Insufficient documentation
